# Patient Record
Sex: Female | Born: 1985
Health system: Southern US, Community
[De-identification: ages and names within clinical notes are randomized; demographics above are authoritative.]

## PROBLEM LIST (undated history)

## (undated) DIAGNOSIS — M549 Dorsalgia, unspecified: Secondary | ICD-10-CM

## (undated) DIAGNOSIS — M4802 Spinal stenosis, cervical region: Secondary | ICD-10-CM

## (undated) HISTORY — DX: Spinal stenosis, cervical region: M48.02

## (undated) HISTORY — PX: OTHER SURGICAL HISTORY: SHX169

---

## 2014-06-19 ENCOUNTER — Encounter (HOSPITAL_BASED_OUTPATIENT_CLINIC_OR_DEPARTMENT_OTHER): Payer: Self-pay | Admitting: Emergency Medicine

## 2014-06-19 ENCOUNTER — Emergency Department (HOSPITAL_BASED_OUTPATIENT_CLINIC_OR_DEPARTMENT_OTHER)
Admission: EM | Admit: 2014-06-19 | Discharge: 2014-06-19 | Disposition: A | Discharge status: Home / Self Care | Payer: Self-pay | Attending: Emergency Medicine | Admitting: Emergency Medicine

## 2014-06-19 DIAGNOSIS — N898 Other specified noninflammatory disorders of vagina: Secondary | ICD-10-CM

## 2014-06-19 DIAGNOSIS — R102 Pelvic and perineal pain: Secondary | ICD-10-CM | POA: Insufficient documentation

## 2014-06-19 DIAGNOSIS — Z711 Person with feared health complaint in whom no diagnosis is made: Secondary | ICD-10-CM

## 2014-06-19 DIAGNOSIS — L293 Anogenital pruritus, unspecified: Secondary | ICD-10-CM | POA: Insufficient documentation

## 2014-06-19 DIAGNOSIS — Z202 Contact with and (suspected) exposure to infections with a predominantly sexual mode of transmission: Secondary | ICD-10-CM | POA: Insufficient documentation

## 2014-06-19 DIAGNOSIS — Z3202 Encounter for pregnancy test, result negative: Secondary | ICD-10-CM | POA: Insufficient documentation

## 2014-06-19 LAB — URINALYSIS, ROUTINE W REFLEX MICROSCOPIC
Bilirubin Urine: NEGATIVE
Glucose, UA: NEGATIVE mg/dL
Hgb urine dipstick: NEGATIVE
Ketones, ur: NEGATIVE mg/dL
LEUKOCYTES UA: NEGATIVE
Nitrite: NEGATIVE
Protein, ur: NEGATIVE mg/dL
SPECIFIC GRAVITY, URINE: 1.019 (ref 1.005–1.030)
UROBILINOGEN UA: 1 mg/dL (ref 0.0–1.0)
pH: 6.5 (ref 5.0–8.0)

## 2014-06-19 LAB — WET PREP, GENITAL
CLUE CELLS WET PREP: NONE SEEN
Trich, Wet Prep: NONE SEEN
Yeast Wet Prep HPF POC: NONE SEEN

## 2014-06-19 LAB — GC/CHLAMYDIA PROBE AMP (~~LOC~~) NOT AT ARMC
Chlamydia: NEGATIVE
Neisseria Gonorrhea: NEGATIVE

## 2014-06-19 LAB — PREGNANCY, URINE: Preg Test, Ur: NEGATIVE

## 2014-06-19 MED ORDER — AZITHROMYCIN 250 MG PO TABS
1000.0000 mg | ORAL_TABLET | Freq: Once | ORAL | Status: AC
  Administered 2014-06-19: 1000 mg via ORAL
  Filled 2014-06-19: qty 4

## 2014-06-19 MED ORDER — CEFTRIAXONE SODIUM 250 MG IJ SOLR
250.0000 mg | Freq: Once | INTRAMUSCULAR | Status: AC
  Administered 2014-06-19: 250 mg via INTRAMUSCULAR
  Filled 2014-06-19: qty 250

## 2014-06-19 MED ORDER — OXYCODONE-ACETAMINOPHEN 5-325 MG PO TABS
1.0000 | ORAL_TABLET | Freq: Once | ORAL | Status: AC
  Administered 2014-06-19: 1 via ORAL
  Filled 2014-06-19: qty 1

## 2014-06-19 MED ORDER — LIDOCAINE HCL (PF) 1 % IJ SOLN
INTRAMUSCULAR | Status: AC
  Administered 2014-06-19: 1.2 mL
  Filled 2014-06-19: qty 5

## 2014-06-19 MED ORDER — FLUCONAZOLE 50 MG PO TABS
150.0000 mg | ORAL_TABLET | Freq: Once | ORAL | Status: AC
  Administered 2014-06-19: 150 mg via ORAL
  Filled 2014-06-19 (×2): qty 1

## 2014-06-19 NOTE — Discharge Instructions (Signed)
You were seen today for vaginal itching and pelvic pain. Your workup is reassuring. He has no significant tenderness on exam. Your tested and treated for STDs. You should abstain from sexual activity for the next 10 days. She return precautions below.  Sexually Transmitted Disease A sexually transmitted disease (STD) is a disease or infection that may be passed (transmitted) from person to person, usually during sexual activity. This may happen by way of saliva, semen, blood, vaginal mucus, or urine. Common STDs include:   Gonorrhea.   Chlamydia.   Syphilis.   HIV and AIDS.   Genital herpes.   Hepatitis B and C.   Trichomonas.   Human papillomavirus (HPV).   Pubic lice.   Scabies.  Mites.  Bacterial vaginosis. WHAT ARE CAUSES OF STDs? An STD may be caused by bacteria, a virus, or parasites. STDs are often transmitted during sexual activity if one person is infected. However, they may also be transmitted through nonsexual means. STDs may be transmitted after:   Sexual intercourse with an infected person.   Sharing sex toys with an infected person.   Sharing needles with an infected person or using unclean piercing or tattoo needles.  Having intimate contact with the genitals, mouth, or rectal areas of an infected person.   Exposure to infected fluids during birth. WHAT ARE THE SIGNS AND SYMPTOMS OF STDs? Different STDs have different symptoms. Some people may not have any symptoms. If symptoms are present, they may include:   Painful or bloody urination.   Pain in the pelvis, abdomen, vagina, anus, throat, or eyes.   A skin rash, itching, or irritation.  Growths, ulcerations, blisters, or sores in the genital and anal areas.  Abnormal vaginal discharge with or without bad odor.   Penile discharge in men.   Fever.   Pain or bleeding during sexual intercourse.   Swollen glands in the groin area.   Yellow skin and eyes (jaundice). This is  seen with hepatitis.   Swollen testicles.  Infertility.  Sores and blisters in the mouth. HOW ARE STDs DIAGNOSED? To make a diagnosis, your health care provider may:   Take a medical history.   Perform a physical exam.   Take a sample of any discharge to examine.  Swab the throat, cervix, opening to the penis, rectum, or vagina for testing.  Test a sample of your first morning urine.   Perform blood tests.   Perform a Pap test, if this applies.   Perform a colposcopy.   Perform a laparoscopy.  HOW ARE STDs TREATED? Treatment depends on the STD. Some STDs may be treated but not cured.   Chlamydia, gonorrhea, trichomonas, and syphilis can be cured with antibiotic medicine.   Genital herpes, hepatitis, and HIV can be treated, but not cured, with prescribed medicines. The medicines lessen symptoms.   Genital warts from HPV can be treated with medicine or by freezing, burning (electrocautery), or surgery. Warts may come back.   HPV cannot be cured with medicine or surgery. However, abnormal areas may be removed from the cervix, vagina, or vulva.   If your diagnosis is confirmed, your recent sexual partners need treatment. This is true even if they are symptom-free or have a negative culture or evaluation. They should not have sex until their health care providers say it is okay. HOW CAN I REDUCE MY RISK OF GETTING AN STD? Take these steps to reduce your risk of getting an STD:  Use latex condoms, dental dams, and water-soluble lubricants  during sexual activity. Do not use petroleum jelly or oils.  Avoid having multiple sex partners.  Do not have sex with someone who has other sex partners.  Do not have sex with anyone you do not know or who is at high risk for an STD.  Avoid risky sex practices that can break your skin.  Do not have sex if you have open sores on your mouth or skin.  Avoid drinking too much alcohol or taking illegal drugs. Alcohol and  drugs can affect your judgment and put you in a vulnerable position.  Avoid engaging in oral and anal sex acts.  Get vaccinated for HPV and hepatitis. If you have not received these vaccines in the past, talk to your health care provider about whether one or both might be right for you.   If you are at risk of being infected with HIV, it is recommended that you take a prescription medicine daily to prevent HIV infection. This is called pre-exposure prophylaxis (PrEP). You are considered at risk if:  You are a man who has sex with other men (MSM).  You are a heterosexual man or woman and are sexually active with more than one partner.  You take drugs by injection.  You are sexually active with a partner who has HIV.  Talk with your health care provider about whether you are at high risk of being infected with HIV. If you choose to begin PrEP, you should first be tested for HIV. You should then be tested every 3 months for as long as you are taking PrEP.  WHAT SHOULD I DO IF I THINK I HAVE AN STD?  See your health care provider.   Tell your sexual partner(s). They should be tested and treated for any STDs.  Do not have sex until your health care provider says it is okay. WHEN SHOULD I GET IMMEDIATE MEDICAL CARE? Contact your health care provider right away if:   You have severe abdominal pain.  You are a man and notice swelling or pain in your testicles.  You are a woman and notice swelling or pain in your vagina. Document Released: 04/23/2002 Document Revised: 02/05/2013 Document Reviewed: 08/21/2012 [facility] Patient Information 2015 [place], [place]. This information is not intended to replace advice given to you by your health care provider. Make sure you discuss any questions you have with your health care provider.

## 2014-06-19 NOTE — ED Provider Notes (Signed)
CSN: [id number]     Arrival date & time 06/19/14  0155 History   First MD Initiated Contact with Patient 06/19/14 0209     Chief Complaint  Patient presents with  . Vaginal Itching     (Consider location/radiation/quality/duration/timing/severity/associated sxs/prior Treatment) HPI  This is a 29 year old female with no significant past medical history who presents with vaginal itching and pelvic pain. Patient reports 3 day history of vaginal itching and odor. She also reports crampy, sharp pelvic pain that does not lateralize the last several days. Reports that at 10 pain. She's not taking any medications for her pain. She denies any dysuria or hematuria. She denies any vaginal discharge. She is currently sexually active with one partner. She reports consistent condom use. Last menstrual period was 2 weeks ago. She is not on birth control.  Patient is unable to get into her primary physician. Patient denies any nausea, vomiting, diarrhea, fevers.  History reviewed. No pertinent past medical history. History reviewed. No pertinent past surgical history. History reviewed. No pertinent family history. History  Substance Use Topics  . Smoking status: Never Smoker   . Smokeless tobacco: Not on file  . Alcohol Use: No   OB History    No data available     Review of Systems  Constitutional: Negative for fever.  Respiratory: Negative for chest tightness and shortness of breath.   Cardiovascular: Negative for chest pain.  Gastrointestinal: Negative for nausea, vomiting and abdominal pain.  Genitourinary: Positive for pelvic pain. Negative for dysuria, vaginal bleeding and vaginal discharge.  Skin: Negative for rash.  Neurological: Negative for headaches.  All other systems reviewed and are negative.     Allergies  Review of patient's allergies indicates no known allergies.  Home Medications   Prior to Admission medications   Not on File   BP 120/68 mmHg  Pulse 59  Temp(Src)  97.9 F (36.6 C) (Oral)  Resp 16  Ht 5\' 6"  (1.676 m)  Wt 172 lb (78.019 kg)  BMI 27.77 kg/m2  SpO2 100%  LMP 06/05/2014 (Exact Date) Physical Exam  Constitutional: She is oriented to person, place, and time. She appears well-developed and well-nourished. No distress.  HENT:  Head: Normocephalic and atraumatic.  Cardiovascular: Normal rate, regular rhythm and normal heart sounds.   Pulmonary/Chest: Effort normal. No respiratory distress. She has no wheezes.  Abdominal: Soft. Bowel sounds are normal. There is no tenderness. There is no rebound.  Genitourinary: Vagina normal and uterus normal.  No cervical motion tenderness, no cervical friability noticed, no adnexal tenderness, scant vaginal discharge  Neurological: She is alert and oriented to person, place, and time.  Skin: Skin is warm and dry.  Psychiatric: She has a normal mood and affect.  Nursing note and vitals reviewed.   ED Course  Procedures (including critical care time) Labs Review Labs Reviewed  WET PREP, GENITAL - Abnormal; Notable for the following:    WBC, Wet Prep HPF POC MODERATE (*)    All other components within normal limits  PREGNANCY, URINE  URINALYSIS, ROUTINE W REFLEX MICROSCOPIC  RPR  HIV ANTIBODY (ROUTINE TESTING)  GC/CHLAMYDIA PROBE AMP ([place])    Imaging Review No results found.   EKG Interpretation None      MDM   Final diagnoses:  Vaginal itching  Pelvic pain in female  Concern about STD in female without diagnosis    Patient presents with vaginal itching and pelvic pain. Nontoxic on exam. Nontender on exam. GU exam is largely unremarkable  without cervical motion tenderness or adnexal tenderness. Discuss with patient screening for STDs and empirically treatment. Patient agrees with plan. Patient given Rocephin and azithromycin. GC, chlamydia, RPR, and HIV pending. Wet prep is reassuring and urinalysis is unremarkable. Patient is well-appearing. Do not feel she needs imaging at  this time. Low suspicion for ovarian torsion or other intra-abdominal pathology including appendicitis. Patient to follow-up with PCP.  After history, exam, and medical workup I feel the patient has been appropriately medically screened and is safe for discharge home. Pertinent diagnoses were discussed with the patient. Patient was given return precautions.    Shon Batonourtney F Horton, MD 06/19/14 [id number]

## 2014-06-19 NOTE — ED Notes (Signed)
Patient states that she has had an "odar" and itching x 2 - 3 days with pelvic pain. Attempted to go to MD and patient was unable to get appointment.

## 2014-06-20 LAB — HIV ANTIBODY (ROUTINE TESTING W REFLEX): HIV Screen 4th Generation wRfx: NONREACTIVE

## 2014-06-20 LAB — RPR: RPR: NONREACTIVE

## 2014-09-12 ENCOUNTER — Emergency Department (HOSPITAL_BASED_OUTPATIENT_CLINIC_OR_DEPARTMENT_OTHER)
Admission: EM | Admit: 2014-09-12 | Discharge: 2014-09-13 | Disposition: A | Discharge status: Home / Self Care | Payer: Medicaid - Out of State | Attending: Emergency Medicine | Admitting: Emergency Medicine

## 2014-09-12 ENCOUNTER — Encounter (HOSPITAL_BASED_OUTPATIENT_CLINIC_OR_DEPARTMENT_OTHER): Payer: Self-pay | Admitting: *Deleted

## 2014-09-12 DIAGNOSIS — S60211A Contusion of right wrist, initial encounter: Secondary | ICD-10-CM | POA: Insufficient documentation

## 2014-09-12 DIAGNOSIS — S40011A Contusion of right shoulder, initial encounter: Secondary | ICD-10-CM | POA: Insufficient documentation

## 2014-09-12 DIAGNOSIS — Y9389 Activity, other specified: Secondary | ICD-10-CM | POA: Diagnosis not present

## 2014-09-12 DIAGNOSIS — Y990 Civilian activity done for income or pay: Secondary | ICD-10-CM | POA: Diagnosis not present

## 2014-09-12 DIAGNOSIS — W010XXA Fall on same level from slipping, tripping and stumbling without subsequent striking against object, initial encounter: Secondary | ICD-10-CM | POA: Diagnosis not present

## 2014-09-12 DIAGNOSIS — Y9289 Other specified places as the place of occurrence of the external cause: Secondary | ICD-10-CM | POA: Insufficient documentation

## 2014-09-12 DIAGNOSIS — S4991XA Unspecified injury of right shoulder and upper arm, initial encounter: Secondary | ICD-10-CM | POA: Diagnosis present

## 2014-09-12 DIAGNOSIS — S20221A Contusion of right back wall of thorax, initial encounter: Secondary | ICD-10-CM | POA: Diagnosis not present

## 2014-09-12 NOTE — ED Notes (Signed)
Patient states she tripped at work falling on her right side and now has back/ shoulder and wrist pain. Denies hitting head. Rates pain 8/10. Patient states she has had 2 Ibuprofen around 1930 (for her menstrual pain, not injury).

## 2014-09-12 NOTE — ED Notes (Signed)
Pt fell at work, reports right shoulder and mid back pain.  Ambulatory.

## 2014-09-13 ENCOUNTER — Emergency Department (HOSPITAL_BASED_OUTPATIENT_CLINIC_OR_DEPARTMENT_OTHER): Payer: Medicaid - Out of State

## 2014-09-13 MED ORDER — TRAMADOL HCL 50 MG PO TABS: 50.0000 mg | ORAL_TABLET | Freq: Four times a day (QID) | ORAL | Status: DC | PRN

## 2014-09-13 NOTE — ED Provider Notes (Signed)
CSN: [id number]     Arrival date & time 09/12/14  2236 History   First MD Initiated Contact with Patient 09/13/14 0038     Chief Complaint  Patient presents with  . Fall     (Consider location/radiation/quality/duration/timing/severity/associated sxs/prior Treatment) Patient is a 29 y.o. female presenting with fall. The history is provided by the patient.  Fall  She was at work when she tripped and fell injuring her right shoulder, right wrist, and middle back. She is complaining of pain which he rates at 7/10. She denies head, neck injury and denies injury to the left arm or to her legs. She took a dose of ibuprofen for menstrual cramps but it did not seem to affect her pain.  History reviewed. No pertinent past medical history. History reviewed. No pertinent past surgical history. History reviewed. No pertinent family history. History  Substance Use Topics  . Smoking status: Never Smoker   . Smokeless tobacco: Not on file  . Alcohol Use: No   OB History    No data available     Review of Systems  All other systems reviewed and are negative.     Allergies  Review of patient's allergies indicates no known allergies.  Home Medications   Prior to Admission medications   Not on File   BP 115/78 mmHg  Pulse 76  Temp(Src) 97.7 F (36.5 C) (Oral)  Resp 16  Ht  (1.702 m)  Wt 195 lb (88.451 kg)  BMI 30.53 kg/m2  SpO2 100%  LMP 09/12/2014 Physical Exam  Nursing note and vitals reviewed.  29 year old female, resting comfortably and in no acute distress. Vital signs are  normal. Oxygen saturation is 100%, which is normal. Head is normocephalic and atraumatic. PERRLA, EOMI. Oropharynx is clear. Neck is nontender and supple without adenopathy or JVD. Back is  mildly tender in the mid to lower thoracic spine. There is no lumbar tenderness. There is no CVA tenderness. Lungs are clear without rales, wheezes, or rhonchi. Chest is nontender. Heart has regular rate and  rhythm without murmur. Abdomen is soft, flat, nontender without masses or hepatosplenomegaly and peristalsis is normoactive. Extremities: No swelling or deformity present. There is moderate tenderness diffusely through the right shoulder, and there is tenderness over the dorsum of the right wrist. There is no tenderness in the anatomic snuffbox and there is noted pain with axial loading of the thumb. Full passive range of motion is present without pain. Distal neurovascular exam is intact with normal sensation, normal pulses, prompt capillary refill. Skin is warm and dry without rash. Neurologic: Mental status is normal, cranial nerves are intact, there are no motor or sensory deficits.  ED Course  Procedures (including critical care time)  Imaging Review Dg Thoracic Spine W/swimmers  09/13/2014   CLINICAL DATA:  Central midthoracic pain after fall. Fall from slip, trip or stumble. Initial encounter.  EXAM: THORACIC SPINE - 3 VIEWS  COMPARISON:  None.  FINDINGS: The alignment is maintained. Vertebral body heights are maintained. No significant disc space narrowing. Posterior elements appear intact. There is no paravertebral soft tissue abnormality.  IMPRESSION: Negative.   Electronically Signed   By: Rubye Oaks M.D.   On: 09/13/2014 01:33   Dg Shoulder Right  09/13/2014   CLINICAL DATA:  Right shoulder pain after fall  EXAM: RIGHT SHOULDER - 2+ VIEW  COMPARISON:  None.  FINDINGS: There is no evidence of fracture or dislocation. There is no evidence of arthropathy or other focal  bone abnormality. Soft tissues are unremarkable.  IMPRESSION: Negative.   Electronically Signed   By: Ellery Plunk M.D.   On: 09/13/2014 01:33   Dg Wrist Complete Right  09/13/2014   CLINICAL DATA:  Right wrist pain after fall from slip, trip, or stumble. Initial encounter.  EXAM: RIGHT WRIST - COMPLETE 3+ VIEW  COMPARISON:  None.  FINDINGS: No fracture or dislocation. The alignment and joint spaces are maintained.  The scaphoid is intact. There is no focal soft tissue abnormality.  IMPRESSION: Negative.   Electronically Signed   By: Rubye Oaks M.D.   On: 09/13/2014 01:34    MDM   Final diagnoses:  Fall from slip, trip, or stumble, initial encounter  Contusion of right shoulder, initial encounter  Contusion of right wrist, initial encounter  Contusion of mid back, right, initial encounter   Fall at work without evidence of significant injury. She is being sent for x-rays.  X-rays show no evidence of fracture. She is advised to use over-the-counter analgesia as needed for pain. Prescription given for tramadol.  Dione Booze, MD 09/13/14 [phone]

## 2014-09-13 NOTE — Discharge Instructions (Signed)
Take ibuprofen, naproxen, or acetaminophen as needed for less severe pain.  Contusion A contusion is a deep bruise. Contusions are the result of an injury that caused bleeding under the skin. The contusion may turn blue, purple, or yellow. Minor injuries will give you a painless contusion, but more severe contusions may stay painful and swollen for a few weeks.  CAUSES  A contusion is usually caused by a blow, trauma, or direct force to an area of the body. SYMPTOMS   Swelling and redness of the injured area.  Bruising of the injured area.  Tenderness and soreness of the injured area.  Pain. DIAGNOSIS  The diagnosis can be made by taking a history and physical exam. An X-ray, CT scan, or MRI may be needed to determine if there were any associated injuries, such as fractures. TREATMENT  Specific treatment will depend on what area of the body was injured. In general, the best treatment for a contusion is resting, icing, elevating, and applying cold compresses to the injured area. Over-the-counter medicines may also be recommended for pain control. Ask your caregiver what the best treatment is for your contusion. HOME CARE INSTRUCTIONS   Put ice on the injured area.  Put ice in a plastic bag.  Place a towel between your skin and the bag.  Leave the ice on for 15-20 minutes, 3-4 times a day, or as directed by your health care provider.  Only take over-the-counter or prescription medicines for pain, discomfort, or fever as directed by your caregiver. Your caregiver may recommend avoiding anti-inflammatory medicines (aspirin, ibuprofen, and naproxen) for 48 hours because these medicines may increase bruising.  Rest the injured area.  If possible, elevate the injured area to reduce swelling. SEEK IMMEDIATE MEDICAL CARE IF:   You have increased bruising or swelling.  You have pain that is getting worse.  Your swelling or pain is not relieved with medicines. MAKE SURE YOU:    Understand these instructions.  Will watch your condition.  Will get help right away if you are not doing well or get worse. Document Released: 11/10/2004 Document Revised: 02/05/2013 Document Reviewed: 12/06/2010 [facility] Patient Information 2015 [place], [place]. This information is not intended to replace advice given to you by your health care provider. Make sure you discuss any questions you have with your health care provider.  Tramadol tablets What is this medicine? TRAMADOL (TRA ma dole) is a pain reliever. It is used to treat moderate to severe pain in adults. This medicine may be used for other purposes; ask your health care provider or pharmacist if you have questions. COMMON BRAND NAME(S): Ultram What should I tell my health care provider before I take this medicine? They need to know if you have any of these conditions: -brain tumor -depression -drug abuse or addiction -head injury -if you frequently drink alcohol containing drinks -kidney disease or trouble passing urine -liver disease -lung disease, asthma, or breathing problems -seizures or epilepsy -suicidal thoughts, plans, or attempt; a previous suicide attempt by you or a family member -an unusual or allergic reaction to tramadol, codeine, other medicines, foods, dyes, or preservatives -pregnant or trying to get pregnant -breast-feeding How should I use this medicine? Take this medicine by mouth with a full glass of water. Follow the directions on the prescription label. If the medicine upsets your stomach, take it with food or milk. Do not take more medicine than you are told to take. Talk to your pediatrician regarding the use of this medicine in  children. Special care may be needed. Overdosage: If you think you have taken too much of this medicine contact a poison control center or emergency room at once. NOTE: This medicine is only for you. Do not share this medicine with others. What if I miss a dose? If  you miss a dose, take it as soon as you can. If it is almost time for your next dose, take only that dose. Do not take double or extra doses. What may interact with this medicine? Do not take this medicine with any of the following medications: -MAOIs like Carbex, Eldepryl, Marplan, Nardil, and Parnate This medicine may also interact with the following medications: -alcohol or medicines that contain alcohol -antihistamines -benzodiazepines -bupropion -carbamazepine or oxcarbazepine -clozapine -cyclobenzaprine -digoxin -furazolidone -linezolid -medicines for depression, anxiety, or psychotic disturbances -medicines for migraine headache like almotriptan, eletriptan, frovatriptan, naratriptan, rizatriptan, sumatriptan, zolmitriptan -medicines for pain like pentazocine, buprenorphine, butorphanol, meperidine, nalbuphine, and propoxyphene -medicines for sleep -muscle relaxants -naltrexone -phenobarbital -phenothiazines like perphenazine, thioridazine, chlorpromazine, mesoridazine, fluphenazine, prochlorperazine, promazine, and trifluoperazine -procarbazine -warfarin This list may not describe all possible interactions. Give your health care provider a list of all the medicines, herbs, non-prescription drugs, or dietary supplements you use. Also tell them if you smoke, drink alcohol, or use illegal drugs. Some items may interact with your medicine. What should I watch for while using this medicine? Tell your doctor or health care professional if your pain does not go away, if it gets worse, or if you have new or a different type of pain. You may develop tolerance to the medicine. Tolerance means that you will need a higher dose of the medicine for pain relief. Tolerance is normal and is expected if you take this medicine for a long time. Do not suddenly stop taking your medicine because you may develop a severe reaction. Your body becomes used to the medicine. This does NOT mean you are  addicted. Addiction is a behavior related to getting and using a drug for a non-medical reason. If you have pain, you have a medical reason to take pain medicine. Your doctor will tell you how much medicine to take. If your doctor wants you to stop the medicine, the dose will be slowly lowered over time to avoid any side effects. You may get drowsy or dizzy. Do not drive, use machinery, or do anything that needs mental alertness until you know how this medicine affects you. Do not stand or sit up quickly, especially if you are an older patient. This reduces the risk of dizzy or fainting spells. Alcohol can increase or decrease the effects of this medicine. Avoid alcoholic drinks. You may have constipation. Try to have a bowel movement at least every 2 to 3 days. If you do not have a bowel movement for 3 days, call your doctor or health care professional. Your mouth may get dry. Chewing sugarless gum or sucking hard candy, and drinking plenty of water may help. Contact your doctor if the problem does not go away or is severe. What side effects may I notice from receiving this medicine? Side effects that you should report to your doctor or health care professional as soon as possible: -allergic reactions like skin rash, itching or hives, swelling of the face, lips, or tongue -breathing difficulties, wheezing -confusion -itching -light headedness or fainting spells -redness, blistering, peeling or loosening of the skin, including inside the mouth -seizures Side effects that usually do not require medical attention (report to your doctor or  health care professional if they continue or are bothersome): -constipation -dizziness -drowsiness -headache -nausea, vomiting This list may not describe all possible side effects. Call your doctor for medical advice about side effects. You may report side effects to FDA at 1-800-FDA-1088. Where should I keep my medicine? Keep out of the reach of children. Store  at room temperature between 15 and 30 degrees C (59 and 86 degrees F). Keep container tightly closed. Throw away any unused medicine after the expiration date. NOTE: This sheet is a summary. It may not cover all possible information. If you have questions about this medicine, talk to your doctor, pharmacist, or health care provider.  2015, [employer]. (2009-10-14 11:55:44)

## 2014-11-04 ENCOUNTER — Emergency Department (HOSPITAL_BASED_OUTPATIENT_CLINIC_OR_DEPARTMENT_OTHER)
Admission: EM | Admit: 2014-11-04 | Discharge: 2014-11-04 | Disposition: A | Discharge status: Home / Self Care | Attending: Emergency Medicine | Admitting: Emergency Medicine

## 2014-11-04 ENCOUNTER — Emergency Department (HOSPITAL_BASED_OUTPATIENT_CLINIC_OR_DEPARTMENT_OTHER): Payer: Self-pay

## 2014-11-04 ENCOUNTER — Encounter (HOSPITAL_BASED_OUTPATIENT_CLINIC_OR_DEPARTMENT_OTHER): Payer: Self-pay | Admitting: Emergency Medicine

## 2014-11-04 DIAGNOSIS — R52 Pain, unspecified: Secondary | ICD-10-CM

## 2014-11-04 DIAGNOSIS — M25531 Pain in right wrist: Secondary | ICD-10-CM | POA: Insufficient documentation

## 2014-11-04 MED ORDER — IBUPROFEN 800 MG PO TABS
800.0000 mg | ORAL_TABLET | Freq: Once | ORAL | Status: AC
  Administered 2014-11-04: 800 mg via ORAL
  Filled 2014-11-04: qty 1

## 2014-11-04 MED ORDER — IBUPROFEN 800 MG PO TABS: 800.0000 mg | ORAL_TABLET | Freq: Three times a day (TID) | ORAL | Status: DC

## 2014-11-04 NOTE — Discharge Instructions (Signed)
Arthralgia Wear the splint as needed for comfort. Call Dr. Carlos Levering office tomorrow to schedule the next available appointment Arthralgia is joint pain. A joint is a place where two bones meet. Joint pain can happen for many reasons. The joint can be bruised, stiff, infected, or weak from aging. Pain usually goes away after resting and taking medicine for soreness.  HOME CARE  Rest the joint as told by your doctor.  Keep the sore joint raised (elevated) for the first 24 hours.  Put ice on the joint area.  Put ice in a plastic bag.  Place a towel between your skin and the bag.  Leave the ice on for 15-20 minutes, 03-04 times a day.  Wear your splint, casting, elastic bandage, or sling as told by your doctor.  Only take medicine as told by your doctor. Do not take aspirin.  Use crutches as told by your doctor. Do not put weight on the joint until told to by your doctor. GET HELP RIGHT AWAY IF:   You have bruising, puffiness (swelling), or more pain.  Your fingers or toes turn blue or start to lose feeling (numb).  Your medicine does not lessen the pain.  Your pain becomes severe.  You have a temperature by mouth above 102 F (38.9 C), not controlled by medicine.  You cannot move or use the joint. MAKE SURE YOU:   Understand these instructions.  Will watch your condition.  Will get help right away if you are not doing well or get worse. Document Released: 01/19/2009 Document Revised: 04/25/2011 Document Reviewed: 01/19/2009 [facility] Patient Information 2015 [place], [place]. This information is not intended to replace advice given to you by your health care provider. Make sure you discuss any questions you have with your health care provider.

## 2014-11-04 NOTE — ED Notes (Signed)
MD at bedside. 

## 2014-11-04 NOTE — ED Notes (Signed)
Pt had worker comp injury, states she was diagnosed with contusion to right arm, started back full duty last thur, reports pain to right wrist that radiates to shoulder

## 2014-11-04 NOTE — ED Notes (Signed)
Nurse first-pt to desk to ? Wait time-advised by reg clerk how ED pts are seen-pt NAD

## 2014-11-04 NOTE — ED Provider Notes (Signed)
CSN: [id number]     Arrival date & time 11/04/14  1849 History  This chart was scribed for Doug Sou, MD by Elon Spanner, ED Scribe. This patient was seen in room MHFT1/MHFT1 and the patient's care was started at 10:35 PM.   Chief Complaint  Patient presents with  . Shoulder Pain   The history is provided by the patient. No language interpreter was used.   HPI Comments: Brooke Silva is a 29 y.o. female who presents to the Emergency Department complaining of a recurrence of right wrist pain onset 1.5 weeks ago.  The patient was seen after a fall on 7/28 at work.  She was dx'd with a contusion of the right forearm and there were no other abnormalities.  She was placed on light duty for 3 weeks, given a wrist splint, and additionally followed up with her PCP.  Her pain resolved and she returned to work without any duty restrictions 1.5 weeks ago, which caused her wrist pain to recur.  She reports her worker's comp claim was denied.  She has previously taken ibuprofen with some relief but has not taken anything since recurrence.  She also notes a small, gradually worsening area of non-painful swelling on the wrist that developed a few days ago History reviewed. No pertinent past medical history. past medical history negative History reviewed. No pertinent past surgical history. History reviewed. No pertinent family history. Social History  Substance Use Topics  . Smoking status: Never Smoker   . Smokeless tobacco: None  . Alcohol Use: No   OB History    No data available     Review of Systems  Constitutional: Negative.   Musculoskeletal: Positive for arthralgias.      Allergies  Review of patient's allergies indicates no known allergies.  Home Medications   Prior to Admission medications   Medication Sig Start Date End Date Taking? Authorizing Provider  traMADol (ULTRAM) 50 MG tablet Take 1 tablet (50 mg total) by mouth every 6 (six) hours as needed. 09/13/14   Dione Booze,  MD   BP 103/67 mmHg  Pulse 79  Temp(Src) 98.5 F (36.9 C) (Oral)  Resp 16  Ht  (1.702 m)  Wt 195 lb (88.451 kg)  BMI 30.53 kg/m2  SpO2 100%  LMP 10/14/2014 Physical Exam  Constitutional: She appears well-developed and well-nourished.  HENT:  Head: Normocephalic and atraumatic.  Eyes: EOM are normal.  Neck: Neck supple. No tracheal deviation present. No thyromegaly present.  Cardiovascular: Normal rate and regular rhythm.   No murmur heard. Pulmonary/Chest: Effort normal and breath sounds normal.  Abdominal: Soft. Bowel sounds are normal. She exhibits no distension. There is no tenderness.  Musculoskeletal: Normal range of motion. She exhibits tenderness. She exhibits no edema.  0.5 cm tender nodule at dorsum of left wrist. Not red or warm. Wrist with full range of motion with pain on extension. Shoulder elbow and hand with full range of motion without pain. All other extremities without redness or tenderness neurovascular intact  Neurological: She is alert. Coordination normal.  Skin: Skin is warm and dry. No rash noted.  Psychiatric: She has a normal mood and affect.  Nursing note and vitals reviewed.   ED Course  Procedures (including critical care time)  DIAGNOSTIC STUDIES: Oxygen Saturation is 100% on RA, normal by my interpretation.    COORDINATION OF CARE:  10:43 PM Will provide splint and order/prescribe ibuprofen.  Will provide referral to hand surgeon.  Patient acknowledges and agrees with plan.  Labs Review Labs Reviewed - No data to display  Imaging Review Dg Wrist Complete Right  11/04/2014   CLINICAL DATA:  Right wrist pain after falling in June of 2016. Continuous and worsening pain with protrusion of bone mid right wrist during flexion. No recent injury.  EXAM: RIGHT WRIST - COMPLETE 3+ VIEW  COMPARISON:  09/13/2014  FINDINGS: There is no evidence of fracture or dislocation. There is no evidence of arthropathy or other focal bone abnormality. Soft  tissues are unremarkable.  IMPRESSION: Negative.   Electronically Signed   By: Norva Pavlov M.D.   On: 11/04/2014 19:53   I have personally reviewed and evaluated these images and lab results as part of my medical decision-making.   EKG Interpretation None     Results for orders placed or performed during the hospital encounter of 06/19/14  Wet prep, genital  Result Value Ref Range   Yeast Wet Prep HPF POC NONE SEEN NONE SEEN   Trich, Wet Prep NONE SEEN NONE SEEN   Clue Cells Wet Prep HPF POC NONE SEEN NONE SEEN   WBC, Wet Prep HPF POC MODERATE (A) NONE SEEN  Pregnancy, urine  Result Value Ref Range   Preg Test, Ur NEGATIVE NEGATIVE  Urinalysis, Routine w reflex microscopic  Result Value Ref Range   Color, Urine YELLOW YELLOW   APPearance CLEAR CLEAR   Specific Gravity, Urine 1.019 1.005 - 1.030   pH 6.5 5.0 - 8.0   Glucose, UA NEGATIVE NEGATIVE mg/dL   Hgb urine dipstick NEGATIVE NEGATIVE   Bilirubin Urine NEGATIVE NEGATIVE   Ketones, ur NEGATIVE NEGATIVE mg/dL   Protein, ur NEGATIVE NEGATIVE mg/dL   Urobilinogen, UA 1.0 0.0 - 1.0 mg/dL   Nitrite NEGATIVE NEGATIVE   Leukocytes, UA NEGATIVE NEGATIVE  RPR  Result Value Ref Range   RPR Ser Ql Non Reactive Non Reactive  HIV antibody  Result Value Ref Range   HIV Screen 4th Generation wRfx Non Reactive Non Reactive  GC/Chlamydia probe amp ([place])  Result Value Ref Range   Chlamydia Negative    Neisseria gonorrhea Negative    Dg Wrist Complete Right  11/04/2014   CLINICAL DATA:  Right wrist pain after falling in June of 2016. Continuous and worsening pain with protrusion of bone mid right wrist during flexion. No recent injury.  EXAM: RIGHT WRIST - COMPLETE 3+ VIEW  COMPARISON:  09/13/2014  FINDINGS: There is no evidence of fracture or dislocation. There is no evidence of arthropathy or other focal bone abnormality. Soft tissues are unremarkable.  IMPRESSION: Negative.   Electronically Signed   By: Norva Pavlov  M.D.   On: 11/04/2014 19:53    MDM  Plan prescription ibuprofen, Velcro wrist splint. Referral Dr. Amanda Pea Final diagnoses:  Pain   diagnosis chronic right wrist pain  I personally performed the services described in this documentation, which was scribed in my presence. The recorded information has been reviewed and considered.      Doug Sou, MD 11/04/14 2300

## 2014-11-06 ENCOUNTER — Ambulatory Visit: Payer: Medicaid - Out of State | Admitting: Family Medicine

## 2014-12-09 ENCOUNTER — Encounter (HOSPITAL_BASED_OUTPATIENT_CLINIC_OR_DEPARTMENT_OTHER): Payer: Self-pay | Admitting: *Deleted

## 2014-12-09 ENCOUNTER — Emergency Department (HOSPITAL_BASED_OUTPATIENT_CLINIC_OR_DEPARTMENT_OTHER)
Admission: EM | Admit: 2014-12-09 | Discharge: 2014-12-09 | Disposition: A | Discharge status: Home / Self Care | Payer: Medicaid - Out of State | Attending: Emergency Medicine | Admitting: Emergency Medicine

## 2014-12-09 DIAGNOSIS — K2970 Gastritis, unspecified, without bleeding: Secondary | ICD-10-CM | POA: Diagnosis not present

## 2014-12-09 DIAGNOSIS — R350 Frequency of micturition: Secondary | ICD-10-CM | POA: Insufficient documentation

## 2014-12-09 DIAGNOSIS — R1110 Vomiting, unspecified: Secondary | ICD-10-CM | POA: Insufficient documentation

## 2014-12-09 DIAGNOSIS — R0602 Shortness of breath: Secondary | ICD-10-CM | POA: Diagnosis not present

## 2014-12-09 DIAGNOSIS — Z791 Long term (current) use of non-steroidal anti-inflammatories (NSAID): Secondary | ICD-10-CM | POA: Insufficient documentation

## 2014-12-09 DIAGNOSIS — K920 Hematemesis: Secondary | ICD-10-CM | POA: Diagnosis present

## 2014-12-09 MED ORDER — OMEPRAZOLE 20 MG PO CPDR: 20.0000 mg | DELAYED_RELEASE_CAPSULE | Freq: Every day | ORAL | Status: DC

## 2014-12-09 MED ORDER — ACETAMINOPHEN 325 MG PO TABS
650.0000 mg | ORAL_TABLET | Freq: Four times a day (QID) | ORAL | Status: DC | PRN
Start: 2014-12-09 — End: 2015-01-08

## 2014-12-09 MED ORDER — TRAMADOL HCL 50 MG PO TABS: 50.0000 mg | ORAL_TABLET | Freq: Four times a day (QID) | ORAL | Status: DC | PRN

## 2014-12-09 MED ORDER — PANTOPRAZOLE SODIUM 40 MG PO TBEC
40.0000 mg | DELAYED_RELEASE_TABLET | Freq: Once | ORAL | Status: AC
  Administered 2014-12-09: 40 mg via ORAL
  Filled 2014-12-09: qty 1

## 2014-12-09 NOTE — ED Provider Notes (Signed)
CSN: [id number]     Arrival date & time 12/09/14  1853 History  By signing my name below, I, Gwenyth Ober, attest that this documentation has been prepared under the direction and in the presence of Marily Memos, MD.  Electronically Signed: Gwenyth Ober, ED Scribe. 12/09/2014. 7:38 PM.   Chief Complaint  Patient presents with  . hematemesis yesterday    The history is provided by the patient. No language interpreter was used.    HPI Comments: Brooke Silva is a 29 y.o. female with no chronic medical history who presents to the Emergency Department complaining of 1 episode of blood-streaked emesis that occurred yesterday. Pt reports a burning epigastric pain that starts 15 minutes after eating, mild SOB that started a few days ago, urinary frequency and increased flatulence that started 1 week ago as associated symptoms. Her pain is constant, but becomes worse with eating. Pt has been taking Ibuprofen-800 mg, as needed, for shoulder pain. She last took treatment this morning. Pt also takes Metronidazole for recurrent infections.  Pt denies a history of similar symptoms, a history of indigestion, EtOH use and drug abuse. She also denies fever, cough, back pain and rash   History reviewed. No pertinent past medical history. History reviewed. No pertinent past surgical history. History reviewed. No pertinent family history. Social History  Substance Use Topics  . Smoking status: Never Smoker   . Smokeless tobacco: None  . Alcohol Use: No   OB History    No data available     Review of Systems  Constitutional: Negative for fever.  Respiratory: Positive for shortness of breath. Negative for cough.   Gastrointestinal: Positive for vomiting and abdominal pain.  Genitourinary: Positive for frequency.  Musculoskeletal: Negative for back pain.  Skin: Negative for rash.  All other systems reviewed and are negative.  Allergies  Review of patient's allergies indicates no known  allergies.  Home Medications   Prior to Admission medications   Medication Sig Start Date End Date Taking? Authorizing Provider  acetaminophen (TYLENOL) 325 MG tablet Take 2 tablets (650 mg total) by mouth every 6 (six) hours as needed for mild pain. 12/09/14   Marily Memos, MD  ibuprofen (ADVIL,MOTRIN) 800 MG tablet Take 1 tablet (800 mg total) by mouth 3 (three) times daily. 11/04/14   Doug Sou, MD  omeprazole (PRILOSEC) 20 MG capsule Take 1 capsule (20 mg total) by mouth daily. 12/09/14   Marily Memos, MD  traMADol (ULTRAM) 50 MG tablet Take 1 tablet (50 mg total) by mouth every 6 (six) hours as needed. 12/09/14   Marily Memos, MD   BP 118/74 mmHg  Pulse 72  Temp(Src) 98.5 F (36.9 C) (Oral)  Resp 20  Ht  (1.676 m)  Wt 176 lb (79.833 kg)  BMI 28.42 kg/m2  SpO2 99%  LMP 11/09/2014 Physical Exam  Constitutional: She appears well-developed and well-nourished. No distress.  HENT:  Head: Normocephalic and atraumatic.  Eyes: Conjunctivae and EOM are normal.  Neck: Neck supple. No tracheal deviation present.  Cardiovascular: Normal rate, regular rhythm and normal heart sounds.   Pulmonary/Chest: Effort normal and breath sounds normal. No respiratory distress. She has no wheezes.  Abdominal: Soft. Bowel sounds are normal. She exhibits no distension and no mass. There is no tenderness. There is no rebound and no guarding.  Mild epigastric tenderness No peritonitis No rigidity No surgical scars  Skin: Skin is warm and dry. No rash noted.  Psychiatric: She has a normal mood and affect. Her  behavior is normal.  Nursing note and vitals reviewed.   ED Course  Procedures  DIAGNOSTIC STUDIES: Oxygen Saturation is 99% on RA, normal by my interpretation.    COORDINATION OF CARE: 7:20 PM Discussed suspicion for peptic ulcer. Advised pt to take Omeprazole for 1 month and to refrain from NSAID use. Discussed return precautions, including worsening abdominal pain and vomiting  blood. Discussed treatment plan with pt. She agreed to plan.  Labs Review Labs Reviewed - No data to display  Imaging Review No results found.    EKG Interpretation None      MDM   Final diagnoses:  Gastritis   Increased ibuprofen use over the last couple of weeks secondary to shoulder and wrist pain from a fall. Had epigastric pain for the last 1 week, worse after eating. Had one episode of blood-streaked emesis yesterday. But no further abdominal pain or hematemesis. Exam is benign as above. No evidence of peritonitis or perforated ulcer. Vital signs stable. We'll start on omeprazole and discontinue all NSAID use. She can use Tylenol as needed for her pain. She will follow-up with her doctor in a month to ensure that her symptoms are improving and to ensure she has no need for further workup or treatment. She will return here for any worsening or changing symptoms.  I personally performed the services described in this documentation, which was scribed in my presence. The recorded information has been reviewed and is accurate.  I have personally and contemperaneously reviewed labs and imaging and used in my decision making as above.   A medical screening exam was performed and I feel the patient has had an appropriate workup for their chief complaint at this time and likelihood of emergent condition existing is low. They have been counseled on decision, discharge, follow up and which symptoms necessitate immediate return to the emergency department. They or their family verbally stated understanding and agreement with plan and discharged in stable condition.    Marily MemosJason Jermell Holeman, MD 12/09/14 [phone]

## 2014-12-09 NOTE — ED Notes (Signed)
Patient verbalizes understanding of discharge medication and instructions.  Patient stable and ambulatory. 

## 2014-12-09 NOTE — ED Notes (Signed)
Pt amb to triage with quick steady gait in nad. Pt states she has been taking 800mg  motrin for shoulder pain, and she had some emesis with blood in it yesterday.

## 2015-01-07 ENCOUNTER — Encounter (HOSPITAL_BASED_OUTPATIENT_CLINIC_OR_DEPARTMENT_OTHER): Payer: Self-pay | Admitting: Emergency Medicine

## 2015-01-07 ENCOUNTER — Emergency Department (HOSPITAL_BASED_OUTPATIENT_CLINIC_OR_DEPARTMENT_OTHER)
Admission: EM | Admit: 2015-01-07 | Discharge: 2015-01-08 | Disposition: A | Discharge status: Home / Self Care | Payer: Medicaid - Out of State | Attending: Emergency Medicine | Admitting: Emergency Medicine

## 2015-01-07 DIAGNOSIS — Z791 Long term (current) use of non-steroidal anti-inflammatories (NSAID): Secondary | ICD-10-CM | POA: Insufficient documentation

## 2015-01-07 DIAGNOSIS — J069 Acute upper respiratory infection, unspecified: Secondary | ICD-10-CM | POA: Insufficient documentation

## 2015-01-07 DIAGNOSIS — R05 Cough: Secondary | ICD-10-CM | POA: Diagnosis present

## 2015-01-07 DIAGNOSIS — Z79899 Other long term (current) drug therapy: Secondary | ICD-10-CM | POA: Diagnosis not present

## 2015-01-07 NOTE — ED Notes (Signed)
Patient states that she has been coughing and "pucking" - she reports that she is having SOB and she would like to see if she has a "chest congestion"

## 2015-01-07 NOTE — ED Notes (Signed)
Patient has not stopped texting or looking on phone during the whole triage process

## 2015-01-08 ENCOUNTER — Emergency Department (HOSPITAL_BASED_OUTPATIENT_CLINIC_OR_DEPARTMENT_OTHER): Payer: Medicaid - Out of State

## 2015-01-08 MED ORDER — BENZONATATE 100 MG PO CAPS
100.0000 mg | ORAL_CAPSULE | Freq: Once | ORAL | Status: AC
  Administered 2015-01-08: 100 mg via ORAL
  Filled 2015-01-08: qty 1

## 2015-01-08 MED ORDER — ALBUTEROL SULFATE HFA 108 (90 BASE) MCG/ACT IN AERS: 2.0000 | INHALATION_SPRAY | RESPIRATORY_TRACT | Status: DC | PRN

## 2015-01-08 MED ORDER — BENZONATATE 100 MG PO CAPS: 100.0000 mg | ORAL_CAPSULE | Freq: Three times a day (TID) | ORAL | Status: DC | PRN

## 2015-01-08 MED ORDER — IBUPROFEN 800 MG PO TABS: 800.0000 mg | ORAL_TABLET | Freq: Three times a day (TID) | ORAL | Status: DC | PRN

## 2015-01-08 NOTE — ED Provider Notes (Signed)
TIME SEEN: 12:50 AM  CHIEF COMPLAINT: Cough, wheezing  HPI: Pt is a 29 y.o. female who presents emergency department with cough that is nonproductive and wheezing for the past 2 days. No fever. No history of asthma or COPD. She is a nonsmoker. Denies history of influenza vaccination this year. No sick contacts.  ROS: See HPI Constitutional: no fever  Eyes: no drainage  ENT: no runny nose   Cardiovascular:  no chest pain  Resp: no SOB  GI: no vomiting GU: no dysuria Integumentary: no rash  Allergy: no hives  Musculoskeletal: no leg swelling  Neurological: no slurred speech ROS otherwise negative  PAST MEDICAL HISTORY/PAST SURGICAL HISTORY:  History reviewed. No pertinent past medical history.  MEDICATIONS:  Prior to Admission medications   Medication Sig Start Date End Date Taking? Authorizing Provider  acetaminophen (TYLENOL) 325 MG tablet Take 2 tablets (650 mg total) by mouth every 6 (six) hours as needed for mild pain. 12/09/14   Marily MemosJason Mesner, MD  ibuprofen (ADVIL,MOTRIN) 800 MG tablet Take 1 tablet (800 mg total) by mouth 3 (three) times daily. 11/04/14   Doug SouSam Jacubowitz, MD  omeprazole (PRILOSEC) 20 MG capsule Take 1 capsule (20 mg total) by mouth daily. 12/09/14   Marily MemosJason Mesner, MD  traMADol (ULTRAM) 50 MG tablet Take 1 tablet (50 mg total) by mouth every 6 (six) hours as needed. 12/09/14   Marily MemosJason Mesner, MD    ALLERGIES:  No Known Allergies  SOCIAL HISTORY:  Social History  Substance Use Topics  . Smoking status: Never Smoker   . Smokeless tobacco: Not on file  . Alcohol Use: No    FAMILY HISTORY: History reviewed. No pertinent family history.  EXAM: BP 126/83 mmHg  Pulse 71  Temp(Src) 98.4 F (36.9 C) (Oral)  Resp 16  Ht 5\' 6"  (1.676 m)  Wt 177 lb (80.287 kg)  BMI 28.58 kg/m2  SpO2 100%  LMP 12/09/2014 CONSTITUTIONAL: Alert and oriented and responds appropriately to questions. Well-appearing; well-nourished; afebrile, nontoxic HEAD: Normocephalic EYES:  Conjunctivae clear, PERRL ENT: normal nose; no rhinorrhea; moist mucous membranes; pharynx without lesions noted NECK: Supple, no meningismus, no LAD  CARD: RRR; S1 and S2 appreciated; no murmurs, no clicks, no rubs, no gallops RESP: Normal chest excursion without splinting or tachypnea; breath sounds clear and equal bilaterally; no wheezes, no rhonchi, no rales, no hypoxia or respiratory distress, speaking full sentences, no increased work of breathing ABD/GI: Normal bowel sounds; non-distended; soft, non-tender, no rebound, no guarding, no peritoneal signs BACK:  The back appears normal and is non-tender to palpation, there is no CVA tenderness EXT: Normal ROM in all joints; non-tender to palpation; no edema; normal capillary refill; no cyanosis, no calf tenderness or swelling    SKIN: Normal color for age and race; warm NEURO: Moves all extremities equally, sensation to light touch intact diffusely, cranial nerves II through XII intact PSYCH: The patient's mood and manner are appropriate. Grooming and personal hygiene are appropriate.  MEDICAL DECISION MAKING: Patient here with complaints of cough, wheezing. Afebrile, nontoxic, no respiratory distress, no hypoxia. Chest x-ray clear. Likely viral URI. Given reported history of wheezing we'll discharge with inhaler. Lungs are clear to auscultation currently. We'll discharge with Tessalon Perles and ibuprofen for symptomatic relief. I do not feel she needs antibiotics. Recommended outpatient follow-up as needed. Discussed return precautions. I do not feel she needs further emergent workup. She verbalized understanding and is comfortable with this plan.       Layla MawKristen N Ward, DO  01/08/15 0815 

## 2015-01-08 NOTE — Discharge Instructions (Signed)
Upper Respiratory Infection, Adult Most upper respiratory infections (URIs) are a viral infection of the air passages leading to the lungs. A URI affects the nose, throat, and upper air passages. The most common type of URI is nasopharyngitis and is typically referred to as "the common cold." URIs run their course and usually go away on their own. Most of the time, a URI does not require medical attention, but sometimes a bacterial infection in the upper airways can follow a viral infection. This is called a secondary infection. Sinus and middle ear infections are common types of secondary upper respiratory infections. Bacterial pneumonia can also complicate a URI. A URI can worsen asthma and chronic obstructive pulmonary disease (COPD). Sometimes, these complications can require emergency medical care and may be life threatening.  CAUSES Almost all URIs are caused by viruses. A virus is a type of germ and can spread from one person to another.  RISKS FACTORS You may be at risk for a URI if:   You smoke.   You have chronic heart or lung disease.  You have a weakened defense (immune) system.   You are very young or very old.   You have nasal allergies or asthma.  You work in crowded or poorly ventilated areas.  You work in health care facilities or schools. SIGNS AND SYMPTOMS  Symptoms typically develop 2-3 days after you come in contact with a cold virus. Most viral URIs last 7-10 days. However, viral URIs from the influenza virus (flu virus) can last 14-18 days and are typically more severe. Symptoms may include:   Runny or stuffy (congested) nose.   Sneezing.   Cough.   Sore throat.   Headache.   Fatigue.   Fever.   Loss of appetite.   Pain in your forehead, behind your eyes, and over your cheekbones (sinus pain).  Muscle aches.  DIAGNOSIS  Your health care provider may diagnose a URI by:  Physical exam.  Tests to check that your symptoms are not due to  another condition such as:  Strep throat.  Sinusitis.  Pneumonia.  Asthma. TREATMENT  A URI goes away on its own with time. It cannot be cured with medicines, but medicines may be prescribed or recommended to relieve symptoms. Medicines may help:  Reduce your fever.  Reduce your cough.  Relieve nasal congestion. HOME CARE INSTRUCTIONS   Take medicines only as directed by your health care provider.   Gargle warm saltwater or take cough drops to comfort your throat as directed by your health care provider.  Use a warm mist humidifier or inhale steam from a shower to increase air moisture. This may make it easier to breathe.  Drink enough fluid to keep your urine clear or pale yellow.   Eat soups and other clear broths and maintain good nutrition.   Rest as needed.   Return to work when your temperature has returned to normal or as your health care provider advises. You may need to stay home longer to avoid infecting others. You can also use a face mask and careful hand washing to prevent spread of the virus.  Increase the usage of your inhaler if you have asthma.   Do not use any tobacco products, including cigarettes, chewing tobacco, or electronic cigarettes. If you need help quitting, ask your health care provider. PREVENTION  The best way to protect yourself from getting a cold is to practice good hygiene.   Avoid oral or hand contact with people with cold   symptoms.   Wash your hands often if contact occurs.  There is no clear evidence that vitamin C, vitamin E, echinacea, or exercise reduces the chance of developing a cold. However, it is always recommended to get plenty of rest, exercise, and practice good nutrition.  SEEK MEDICAL CARE IF:   You are getting worse rather than better.   Your symptoms are not controlled by medicine.   You have chills.  You have worsening shortness of breath.  You have brown or red mucus.  You have yellow or brown nasal  discharge.  You have pain in your face, especially when you bend forward.  You have a fever.  You have swollen neck glands.  You have pain while swallowing.  You have white areas in the back of your throat. SEEK IMMEDIATE MEDICAL CARE IF:   You have severe or persistent:  Headache.  Ear pain.  Sinus pain.  Chest pain.  You have chronic lung disease and any of the following:  Wheezing.  Prolonged cough.  Coughing up blood.  A change in your usual mucus.  You have a stiff neck.  You have changes in your:  Vision.  Hearing.  Thinking.  Mood. MAKE SURE YOU:   Understand these instructions.  Will watch your condition.  Will get help right away if you are not doing well or get worse.   This information is not intended to replace advice given to you by your health care provider. Make sure you discuss any questions you have with your health care provider.   Document Released: 07/27/2000 Document Revised: 06/17/2014 Document Reviewed: 05/08/2013 Elsevier Interactive Patient Education 2016 [employer]  

## 2015-02-14 ENCOUNTER — Encounter (HOSPITAL_BASED_OUTPATIENT_CLINIC_OR_DEPARTMENT_OTHER): Payer: Self-pay | Admitting: *Deleted

## 2015-02-14 ENCOUNTER — Emergency Department (HOSPITAL_BASED_OUTPATIENT_CLINIC_OR_DEPARTMENT_OTHER)
Admission: EM | Admit: 2015-02-14 | Discharge: 2015-02-14 | Disposition: A | Discharge status: Home / Self Care | Payer: Medicaid - Out of State | Attending: Emergency Medicine | Admitting: Emergency Medicine

## 2015-02-14 DIAGNOSIS — Z3202 Encounter for pregnancy test, result negative: Secondary | ICD-10-CM | POA: Insufficient documentation

## 2015-02-14 DIAGNOSIS — N898 Other specified noninflammatory disorders of vagina: Secondary | ICD-10-CM | POA: Insufficient documentation

## 2015-02-14 DIAGNOSIS — R1030 Lower abdominal pain, unspecified: Secondary | ICD-10-CM | POA: Diagnosis present

## 2015-02-14 DIAGNOSIS — B349 Viral infection, unspecified: Secondary | ICD-10-CM

## 2015-02-14 DIAGNOSIS — Z79899 Other long term (current) drug therapy: Secondary | ICD-10-CM | POA: Diagnosis not present

## 2015-02-14 LAB — URINALYSIS, ROUTINE W REFLEX MICROSCOPIC
BILIRUBIN URINE: NEGATIVE
GLUCOSE, UA: NEGATIVE mg/dL
Hgb urine dipstick: NEGATIVE
KETONES UR: NEGATIVE mg/dL
Leukocytes, UA: NEGATIVE
Nitrite: NEGATIVE
PH: 5.5 (ref 5.0–8.0)
Protein, ur: NEGATIVE mg/dL
Specific Gravity, Urine: 1.01 (ref 1.005–1.030)

## 2015-02-14 LAB — WET PREP, GENITAL
Clue Cells Wet Prep HPF POC: NONE SEEN
SPERM: NONE SEEN
TRICH WET PREP: NONE SEEN
YEAST WET PREP: NONE SEEN

## 2015-02-14 LAB — PREGNANCY, URINE: Preg Test, Ur: NEGATIVE

## 2015-02-14 MED ORDER — ALBUTEROL SULFATE HFA 108 (90 BASE) MCG/ACT IN AERS
2.0000 | INHALATION_SPRAY | RESPIRATORY_TRACT | Status: DC | PRN
  Administered 2015-02-14: 2 via RESPIRATORY_TRACT
  Filled 2015-02-14: qty 6.7

## 2015-02-14 MED ORDER — ONDANSETRON 8 MG PO TBDP: 8.0000 mg | ORAL_TABLET | Freq: Three times a day (TID) | ORAL | Status: DC | PRN

## 2015-02-14 MED ORDER — IPRATROPIUM-ALBUTEROL 0.5-2.5 (3) MG/3ML IN SOLN
3.0000 mL | RESPIRATORY_TRACT | Status: DC
  Administered 2015-02-14: 3 mL via RESPIRATORY_TRACT
  Filled 2015-02-14: qty 3

## 2015-02-14 MED ORDER — SODIUM CHLORIDE 0.9 % IV BOLUS (SEPSIS)
1000.0000 mL | Freq: Once | INTRAVENOUS | Status: AC
  Administered 2015-02-14: 1000 mL via INTRAVENOUS

## 2015-02-14 MED ORDER — ONDANSETRON HCL 4 MG/2ML IJ SOLN
4.0000 mg | Freq: Once | INTRAMUSCULAR | Status: AC
  Administered 2015-02-14: 4 mg via INTRAVENOUS
  Filled 2015-02-14: qty 2

## 2015-02-14 NOTE — ED Notes (Signed)
Pt able to drink ginger ale 

## 2015-02-14 NOTE — ED Notes (Signed)
C/o abd pain x 3 days  w some n/v/d yesterday

## 2015-02-14 NOTE — Discharge Instructions (Signed)
Viral Infections °A viral infection can be caused by different types of viruses. Most viral infections are not serious and resolve on their own. However, some infections may cause severe symptoms and may lead to further complications. °SYMPTOMS °Viruses can frequently cause: °· Minor sore throat. °· Aches and pains. °· Headaches. °· Runny nose. °· Different types of rashes. °· Watery eyes. °· Tiredness. °· Cough. °· Loss of appetite. °· Gastrointestinal infections, resulting in nausea, vomiting, and diarrhea. °These symptoms do not respond to antibiotics because the infection is not caused by bacteria. However, you might catch a bacterial infection following the viral infection. This is sometimes called a "superinfection." Symptoms of such a bacterial infection may include: °· Worsening sore throat with pus and difficulty swallowing. °· Swollen neck glands. °· Chills and a high or persistent fever. °· Severe headache. °· Tenderness over the sinuses. °· Persistent overall ill feeling (malaise), muscle aches, and tiredness (fatigue). °· Persistent cough. °· Yellow, green, or brown mucus production with coughing. °HOME CARE INSTRUCTIONS  °· Only take over-the-counter or prescription medicines for pain, discomfort, diarrhea, or fever as directed by your caregiver. °· Drink enough water and fluids to keep your urine clear or pale yellow. Sports drinks can provide valuable electrolytes, sugars, and hydration. °· Get plenty of rest and maintain proper nutrition. Soups and broths with crackers or rice are fine. °SEEK IMMEDIATE MEDICAL CARE IF:  °· You have severe headaches, shortness of breath, chest pain, neck pain, or an unusual rash. °· You have uncontrolled vomiting, diarrhea, or you are unable to keep down fluids. °· You or your child has an oral temperature above 102° F (38.9° C), not controlled by medicine. °· Your baby is older than 3 months with a rectal temperature of 102° F (38.9° C) or higher. °· Your baby is 3  months old or younger with a rectal temperature of 100.4° F (38° C) or higher. °MAKE SURE YOU:  °· Understand these instructions. °· Will watch your condition. °· Will get help right away if you are not doing well or get worse. °  °This information is not intended to replace advice given to you by your health care provider. Make sure you discuss any questions you have with your health care provider. °  °Document Released: 11/10/2004 Document Revised: 04/25/2011 Document Reviewed: 07/09/2014 °Elsevier Interactive Patient Education ©2016 [employer] ° °

## 2015-02-14 NOTE — ED Notes (Signed)
abd pain onset 3 days ago ,  Nausea, vomiting diarrhea yesterday,  Denies burning w urination,  Some vag dc

## 2015-02-14 NOTE — ED Provider Notes (Signed)
CSN: [id number]     Arrival date & time 02/14/15  0346 History   First MD Initiated Contact with Patient 02/14/15 0400     Chief Complaint  Patient presents with  . Abdominal Pain     (Consider location/radiation/quality/duration/timing/severity/associated sxs/prior Treatment) HPI  This is a 29 year old female with a three-day history of nausea, vomiting and diarrhea. She is also having some lower abdominal pain that is mild. She has also had a sore throat, shortness of breath and vaginal discharge. She denies vaginal bleeding, dysuria or hematuria. She has not taken anything for her symptoms. She is not aware of having a fever. She states her symptoms feel like the last viral infection she had.   History reviewed. No pertinent past medical history. History reviewed. No pertinent past surgical history. No family history on file. Social History  Substance Use Topics  . Smoking status: Never Smoker   . Smokeless tobacco: None  . Alcohol Use: No   OB History    No data available     Review of Systems  All other systems reviewed and are negative.   Allergies  Review of patient's allergies indicates no known allergies.  Home Medications   Prior to Admission medications   Medication Sig Start Date End Date Taking? Authorizing Provider  albuterol (PROVENTIL HFA;VENTOLIN HFA) 108 (90 BASE) MCG/ACT inhaler Inhale 2 puffs into the lungs every 4 (four) hours as needed for wheezing or shortness of breath. 01/08/15   Kristen N Ward, DO  benzonatate (TESSALON) 100 MG capsule Take 1 capsule (100 mg total) by mouth 3 (three) times daily as needed for cough. 01/08/15   Kristen N Ward, DO  ibuprofen (ADVIL,MOTRIN) 800 MG tablet Take 1 tablet (800 mg total) by mouth every 8 (eight) hours as needed for mild pain. 01/08/15   Kristen N Ward, DO   BP 130/86 mmHg  Pulse 81  Temp(Src) 97.4 F (36.3 C) (Oral)  Resp 18  Ht 5\' 6"  (1.676 m)  Wt 186 lb (84.369 kg)  BMI 30.04 kg/m2  SpO2 100%    Physical Exam  General: Well-developed, well-nourished female in no acute distress; appearance consistent with age of record HENT: normocephalic; atraumatic; pharynx normal Eyes: pupils equal, round and reactive to light; extraocular muscles intact Neck: supple Heart: regular rate and rhythm Lungs: Decreased air movement bilaterally without wheezing Abdomen: soft; nondistended; mild suprapubic tenderness; no masses or hepatosplenomegaly; bowel sounds present GU: Normal external genitalia; no vaginal bleeding; mucoid discharge per cervical os; no cervical motion tenderness; no adnexal tenderness Extremities: No deformity; full range of motion; pulses normal Neurologic: Awake, alert and oriented; motor function intact in all extremities and symmetric; no facial droop Skin: Warm and dry Psychiatric: Flat affect    ED Course  Procedures (including critical care time)   MDM  Nursing notes and vitals signs, including pulse oximetry, reviewed.  Summary of this visit's results, reviewed by myself:  Labs:  Results for orders placed or performed during the hospital encounter of 02/14/15 (from the past 24 hour(s))  Urinalysis, Routine w reflex microscopic (not at [facility])     Status: None   Collection Time: 02/14/15  4:20 AM  Result Value Ref Range   Color, Urine YELLOW YELLOW   APPearance CLEAR CLEAR   Specific Gravity, Urine 1.010 1.005 - 1.030   pH 5.5 5.0 - 8.0   Glucose, UA NEGATIVE NEGATIVE mg/dL   Hgb urine dipstick NEGATIVE NEGATIVE   Bilirubin Urine NEGATIVE NEGATIVE   Ketones, ur NEGATIVE  NEGATIVE mg/dL   Protein, ur NEGATIVE NEGATIVE mg/dL   Nitrite NEGATIVE NEGATIVE   Leukocytes, UA NEGATIVE NEGATIVE  Pregnancy, urine     Status: None   Collection Time: 02/14/15  4:20 AM  Result Value Ref Range   Preg Test, Ur NEGATIVE NEGATIVE  Wet prep, genital     Status: Abnormal   Collection Time: 02/14/15  4:25 AM  Result Value Ref Range   Yeast Wet Prep HPF POC NONE SEEN NONE  SEEN   Trich, Wet Prep NONE SEEN NONE SEEN   Clue Cells Wet Prep HPF POC NONE SEEN NONE SEEN   WBC, Wet Prep HPF POC MANY (A) NONE SEEN   Sperm NONE SEEN    4:45 AM Increased air movement after DuoNeb treatment.  5:37 AM Feeling better. Able to drink fluids without emesis.  Paula Libra, MD 02/14/15 [phone]

## 2015-02-17 LAB — GC/CHLAMYDIA PROBE AMP (~~LOC~~) NOT AT ARMC
Chlamydia: NEGATIVE
Neisseria Gonorrhea: NEGATIVE

## 2015-03-09 ENCOUNTER — Encounter (HOSPITAL_BASED_OUTPATIENT_CLINIC_OR_DEPARTMENT_OTHER): Payer: Self-pay

## 2015-03-09 ENCOUNTER — Other Ambulatory Visit: Payer: Self-pay

## 2015-03-09 ENCOUNTER — Emergency Department (HOSPITAL_BASED_OUTPATIENT_CLINIC_OR_DEPARTMENT_OTHER): Payer: Medicaid - Out of State

## 2015-03-09 ENCOUNTER — Emergency Department (HOSPITAL_BASED_OUTPATIENT_CLINIC_OR_DEPARTMENT_OTHER)
Admission: EM | Admit: 2015-03-09 | Discharge: 2015-03-09 | Disposition: A | Discharge status: Home / Self Care | Payer: Medicaid - Out of State | Attending: Emergency Medicine | Admitting: Emergency Medicine

## 2015-03-09 DIAGNOSIS — R0602 Shortness of breath: Secondary | ICD-10-CM | POA: Insufficient documentation

## 2015-03-09 DIAGNOSIS — M7989 Other specified soft tissue disorders: Secondary | ICD-10-CM | POA: Insufficient documentation

## 2015-03-09 DIAGNOSIS — R42 Dizziness and giddiness: Secondary | ICD-10-CM | POA: Diagnosis not present

## 2015-03-09 DIAGNOSIS — R079 Chest pain, unspecified: Secondary | ICD-10-CM

## 2015-03-09 LAB — CBC
HEMATOCRIT: 35.9 % — AB (ref 36.0–46.0)
Hemoglobin: 11.3 g/dL — ABNORMAL LOW (ref 12.0–15.0)
MCH: 26.1 pg (ref 26.0–34.0)
MCHC: 31.5 g/dL (ref 30.0–36.0)
MCV: 82.9 fL (ref 78.0–100.0)
Platelets: 343 10*3/uL (ref 150–400)
RBC: 4.33 MIL/uL (ref 3.87–5.11)
RDW: 12.5 % (ref 11.5–15.5)
WBC: 5.6 10*3/uL (ref 4.0–10.5)

## 2015-03-09 LAB — BASIC METABOLIC PANEL
ANION GAP: 7 (ref 5–15)
BUN: 10 mg/dL (ref 6–20)
CO2: 24 mmol/L (ref 22–32)
Calcium: 8.9 mg/dL (ref 8.9–10.3)
Chloride: 107 mmol/L (ref 101–111)
Creatinine, Ser: 0.82 mg/dL (ref 0.44–1.00)
GFR calc Af Amer: >60 mL/min (ref >60)
GFR calc non Af Amer: >60 mL/min (ref >60)
GLUCOSE: 159 mg/dL — AB (ref 65–99)
Potassium: 3.5 mmol/L (ref 3.5–5.1)
Sodium: 138 mmol/L (ref 135–145)

## 2015-03-09 LAB — TROPONIN I: Troponin I: <0.03 ng/mL (ref <0.031)

## 2015-03-09 MED ORDER — IBUPROFEN 800 MG PO TABS
800.0000 mg | ORAL_TABLET | Freq: Once | ORAL | Status: AC
  Administered 2015-03-09: 800 mg via ORAL
  Filled 2015-03-09: qty 1

## 2015-03-09 MED ORDER — IBUPROFEN 800 MG PO TABS: 800.0000 mg | ORAL_TABLET | Freq: Three times a day (TID) | ORAL | Status: DC

## 2015-03-09 NOTE — ED Provider Notes (Signed)
CSN: [id number]     Arrival date & time 03/09/15  1500 History  By signing my name below, I, Murriel Hopper, attest that this documentation has been prepared under the direction and in the presence of Jerelyn Scott, MD. Electronically Signed: Murriel Hopper, ED Scribe. 03/09/2015. 3:38 PM.    Chief Complaint  Patient presents with  . Chest Pain      Patient is a 30 y.o. female presenting with chest pain. The history is provided by the patient. No language interpreter was used.  Chest Pain Pain location:  Substernal area Pain quality: sharp   Pain radiates to:  Does not radiate Pain radiates to the back: no   Pain severity:  Mild Onset quality:  Gradual Duration:  8 hours Timing:  Constant Progression:  Improving Chronicity:  New Context: at rest   Relieved by:  None tried Worsened by:  Nothing tried Ineffective treatments:  None tried Associated symptoms: dizziness, lower extremity edema and shortness of breath   Associated symptoms: no cough and no fever    HPI Comments: Brooke Silva is a 30 y.o. female who presents to the Emergency Department complaining of constant, improving central chest pain that has been present since this morning. Pt reports that she stayed home from work because she was having sharp, constant pain, but reports that at this time her pain has subsided some, and is more mild. Pt also reports having associated SOB, lightheadedness, and intermittent leg swelling. Pt states her leg swelling may be from her job, as she is on her feet all day. Pt also reports her SOB does not change when she breathes. Pt states she has not taken any medication to treat her pain prior to coming to ED. Pt denies a hx of blood clots, denies any recent travel, and states she does not take birth control. Pt denies fever or coughing.     History reviewed. No pertinent past medical history. History reviewed. No pertinent past surgical history. No family history on file. Social  History  Substance Use Topics  . Smoking status: Never Smoker   . Smokeless tobacco: None  . Alcohol Use: No   OB History    No data available     Review of Systems  Constitutional: Negative for fever.  Respiratory: Positive for shortness of breath. Negative for cough.   Cardiovascular: Positive for chest pain.  Neurological: Positive for dizziness.    A complete 10 system review of systems was obtained and all systems are negative except as noted in the HPI and PMH.    Allergies  Review of patient's allergies indicates no known allergies.  Home Medications   Prior to Admission medications   Medication Sig Start Date End Date Taking? Authorizing Provider  ibuprofen (ADVIL,MOTRIN) 800 MG tablet Take 1 tablet (800 mg total) by mouth 3 (three) times daily. 03/09/15   Jerelyn Scott, MD   BP 112/69 mmHg  Pulse 66  Temp(Src) 98.4 F (36.9 C) (Oral)  Resp 18  Ht  (1.676 m)  Wt 186 lb (84.369 kg)  BMI 30.04 kg/m2  SpO2 99%  LMP 03/05/2015  Vitals reviewed Physical Exam  Physical Examination: General appearance - alert, well appearing, and in no distress Mental status - alert, oriented to person, place, and time Eyes - no conjunctival injection no scleral icterus Mouth - mucous membranes moist, pharynx normal without lesions Chest - clear to auscultation, no wheezes, rales or rhonchi, symmetric air entry Heart - normal rate, regular rhythm, normal S1,  S2, no murmurs, rubs, clicks or gallops Abdomen - soft, nontender, nondistended, no masses or organomegaly Neurological - alert, oriented, normal speech Extremities - peripheral pulses normal, no pedal edema, no clubbing or cyanosis Skin - normal coloration and turgor, no rashes  ED Course  Procedures (including critical care time)  DIAGNOSTIC STUDIES: Oxygen Saturation is 100% on room air, normal by my interpretation.    COORDINATION OF CARE: 3:32 PM Discussed treatment plan with pt at bedside and pt agreed to  plan.   Labs Review Labs Reviewed  CBC - Abnormal; Notable for the following:    Hemoglobin 11.3 (*)    HCT 35.9 (*)    All other components within normal limits  BASIC METABOLIC PANEL - Abnormal; Notable for the following:    Glucose, Bld 159 (*)    All other components within normal limits  TROPONIN I    Imaging Review Dg Chest 2 View  03/09/2015  CLINICAL DATA:  New cough for 2 weeks, shortness of breath, intermittent mid chest pain worse today EXAM: CHEST  2 VIEW COMPARISON:  01/08/2015 FINDINGS: Normal heart size, mediastinal contours, and pulmonary vascularity. Lungs clear. No pneumothorax. Bones unremarkable. IMPRESSION: Normal exam. Electronically Signed   By: Ulyses Southward M.D.   On: 03/09/2015 15:49   I have personally reviewed and evaluated these images and lab results as part of my medical decision-making.   EKG Interpretation   Date/Time:  Monday March 09 2015 15:08:30 EST Ventricular Rate:  66 PR Interval:  160 QRS Duration: 76 QT Interval:  382 QTC Calculation: 400 R Axis:   70 Text Interpretation:  Normal sinus rhythm Nonspecific ST abnormality  Abnormal ECG No old tracing to compare Confirmed by [facility]  MD, Dionisios Ricci  [phone]) on 03/09/2015 3:24:39 PM      MDM   Final diagnoses:  Chest pain, unspecified chest pain type    Pt presenting with constant sharp chest pain that has been ongoing since this morning.  EKg reassuring, CXR reassuring as well as labs. Pt is a heart score of 0.  Perc negative as well, very low risk for PE.  As labs and workup are reassuring and patient had recent bronchitis suspect inflammation as cause for discomfort.  Discharged with strict return precautions.  Pt agreeable with plan.  I personally performed the services described in this documentation, which was scribed in my presence. The recorded information has been reviewed and is accurate.     Jerelyn Scott, MD 03/09/15 [phone]

## 2015-03-09 NOTE — Discharge Instructions (Signed)
Return to the ED with any concerns including difficulty breathing, worsening pain, fainting, decreased level of alertness/lethargy, or any other alarming symptoms °

## 2015-03-09 NOTE — ED Notes (Signed)
CP x today

## 2015-03-12 ENCOUNTER — Emergency Department (HOSPITAL_BASED_OUTPATIENT_CLINIC_OR_DEPARTMENT_OTHER)
Admission: EM | Admit: 2015-03-12 | Discharge: 2015-03-12 | Disposition: A | Discharge status: Home / Self Care | Payer: Medicaid - Out of State | Attending: Emergency Medicine | Admitting: Emergency Medicine

## 2015-03-12 ENCOUNTER — Encounter (HOSPITAL_BASED_OUTPATIENT_CLINIC_OR_DEPARTMENT_OTHER): Payer: Self-pay | Admitting: *Deleted

## 2015-03-12 DIAGNOSIS — R0602 Shortness of breath: Secondary | ICD-10-CM | POA: Diagnosis not present

## 2015-03-12 DIAGNOSIS — R42 Dizziness and giddiness: Secondary | ICD-10-CM | POA: Insufficient documentation

## 2015-03-12 DIAGNOSIS — R0981 Nasal congestion: Secondary | ICD-10-CM | POA: Insufficient documentation

## 2015-03-12 DIAGNOSIS — R079 Chest pain, unspecified: Secondary | ICD-10-CM | POA: Diagnosis not present

## 2015-03-12 DIAGNOSIS — Z791 Long term (current) use of non-steroidal anti-inflammatories (NSAID): Secondary | ICD-10-CM | POA: Diagnosis not present

## 2015-03-12 DIAGNOSIS — J029 Acute pharyngitis, unspecified: Secondary | ICD-10-CM | POA: Insufficient documentation

## 2015-03-12 LAB — TROPONIN I

## 2015-03-12 NOTE — ED Provider Notes (Signed)
CSN: [id number]     Arrival date & time 03/12/15  1747 History  By signing my name below, I, Tanda Rockers, attest that this documentation has been prepared under the direction and in the presence of Alvira Monday, MD. Electronically Signed: Tanda Rockers, ED Scribe. 03/12/2015. 7:38 PM.   Chief Complaint  Patient presents with  . Chest Pain   Patient is a 31 y.o. female presenting with chest pain. The history is provided by the patient. No language interpreter was used.  Chest Pain Pain location:  Substernal area Pain quality: pressure   Pain radiates to:  Does not radiate Pain radiates to the back: no   Pain severity:  Moderate Onset quality:  Gradual Duration:  12 hours Timing:  Constant Chronicity:  New Associated symptoms: shortness of breath   Associated symptoms: no abdominal pain, no back pain, no cough, no diaphoresis, no fever, no headache, no nausea and not vomiting   Risk factors: no birth control, no immobilization, not female, no prior DVT/PE, no smoking and no surgery      HPI Comments: Brooke Silva is a 30 y.o. female who presents to the Emergency Department complaining of gradual onset, constant, 5/10, mid sternal chest pressure that began this morning. Pt mentions having sharp mid sternal chest pain and shortness of breath for the past 4 days. Pt was seen in the ED on 03/09/2015 (approximately 4 days ago) for the chest pain and shortness of breath. She had an EKG, blood work, and a CXR done with no acute findings and was discharged home. Pt returned today due to the pressure in her chest being new. There are no alleviating factors to the pain. She also complains of lightheadedness, nasal congestion, and a scratchy throat. She mentions intermittent leg swelling from being on her feet for prolonged periods of time at work. Pt does not currently have leg swelling. Denies nausea, diaphoresis, cough, fever, abdominal pain, diarrhea, or any other associated symptoms. PT is  not currently on BC pills. No recent surgery or prolonged travel. No FHx cardiac issues. Pt is non smoker. No hx DVT/PE.   History reviewed. No pertinent past medical history. History reviewed. No pertinent past surgical history. No family history on file. Social History  Substance Use Topics  . Smoking status: Never Smoker   . Smokeless tobacco: None  . Alcohol Use: No   OB History    No data available     Review of Systems  Constitutional: Negative for fever and diaphoresis.  HENT: Positive for congestion and sore throat ([place]atch[place]).   Eyes: Negative for visual disturbance.  Respiratory: Positive for shortness of breath. Negative for cough.   Cardiovascular: Positive for chest pain. Negative for leg swelling.  Gastrointestinal: Negative for nausea, vomiting and abdominal pain.  Genitourinary: Negative for difficulty urinating.  Musculoskeletal: Negative for back pain and neck pain.  Skin: Negative for rash.  Neurological: Positive for light-headedness. Negative for syncope and headaches.    Allergies  Review of patient's allergies indicates no known allergies.  Home Medications   Prior to Admission medications   Medication Sig Start Date End Date Taking? Authorizing Provider  ibuprofen (ADVIL,MOTRIN) 800 MG tablet Take 1 tablet (800 mg total) by mouth 3 (three) times daily. 03/09/15   Jerelyn Scott, MD   BP 128/74 mmHg  Pulse 74  Temp(Src) 98.2 F (36.8 C)  Resp 18  Ht  (1.676 m)  Wt 186 lb (84.369 kg)  BMI 30.04 kg/m2  SpO2 100%  LMP  03/05/2015   Physical Exam  Constitutional: She is oriented to person, place, and time. She appears well-developed and well-nourished. No distress.  HENT:  Head: Normocephalic and atraumatic.  Eyes: Conjunctivae and EOM are normal.  Neck: Neck supple. No tracheal deviation present.  Cardiovascular: Normal rate, regular rhythm and normal heart sounds.   No murmur heard. Pulmonary/Chest: Effort normal and breath sounds normal.  No respiratory distress. She has no wheezes. She has no rhonchi. She has no rales.  Abdominal: Soft. There is no tenderness.  Musculoskeletal: Normal range of motion.  Neurological: She is alert and oriented to person, place, and time.  Skin: Skin is warm and dry.  Psychiatric: She has a normal mood and affect. Her behavior is normal.  Nursing note and vitals reviewed.   ED Course  Procedures (including critical care time)  DIAGNOSTIC STUDIES: Oxygen Saturation is 100% on RA, normal by my interpretation.    COORDINATION OF CARE: 7:27 PM-Discussed treatment plan which includes Troponin with pt at bedside and pt agreed to plan.   Labs Review Labs Reviewed  TROPONIN I    Imaging Review No results found. I have personally reviewed and evaluated these lab results as part of my medical decision-making.   EKG Interpretation   Date/Time:  Thursday March 12 2015 18:02:52 EST Ventricular Rate:  70 PR Interval:  162 QRS Duration: 80 QT Interval:  390 QTC Calculation: 421 R Axis:   56 Text Interpretation:  Normal sinus rhythm Normal ECG No significant change  since last tracing Confirmed by [facility] MD, Jaheim Canino ([id number]) on 03/12/2015  7:25:38 PM      MDM   Final diagnoses:  Chest pain, unspecified chest pain type   30 year old female with no significant medical history history presents with concern of chest pain. Seen days ago for same with negative work up. Differential diagnosis for chest pain includes pulmonary embolus, dissection, pneumothorax, pneumonia, ACS, myocarditis, pericarditis.  EKG was done and evaluate by me and showed no acute ST changes and no signs of pericarditis. Chest x-ray was done 2 days ago and showed no sign of pneumonia or pneumothorax and doubt any changes since that time. Patient is PERC negative and low risk [place] and have low suspicion for PE, discussed with pt that these decision tools are not 100% sensitive, however have low suspicion and pt states  understanding. Patient is low risk HEART score, had troponin completed days ago, and given change in quality of CP, obtained troponin today which was negative. Given constant pain since this AM, do not feel delta troponin is indicated. Given this evaluation, history and physical have low suspicion for pulmonary embolus, pneumonia, ACS, myocarditis, pericarditis, dissection.   Discussed results with pt who is comfortable with plan and has obtained PCP follow up. Patient discharged in stable condition with understanding of reasons to return.   I personally performed the services described in this documentation, which was scribed in my presence. The recorded information has been reviewed and is accurate.   Alvira Monday, MD 03/13/15 1158

## 2015-03-12 NOTE — ED Notes (Signed)
Chest pressure and SOB. She was seen 2 days ago for the same. She is now having pressure.

## 2015-05-15 ENCOUNTER — Ambulatory Visit (INDEPENDENT_AMBULATORY_CARE_PROVIDER_SITE_OTHER): Admitting: Family Medicine

## 2015-05-15 ENCOUNTER — Ambulatory Visit (HOSPITAL_BASED_OUTPATIENT_CLINIC_OR_DEPARTMENT_OTHER)
Admission: RE | Admit: 2015-05-15 | Discharge: 2015-05-15 | Disposition: A | Discharge status: Home / Self Care | Source: Ambulatory Visit | Attending: Family Medicine | Admitting: Family Medicine

## 2015-05-15 ENCOUNTER — Encounter: Payer: Self-pay | Admitting: Family Medicine

## 2015-05-15 ENCOUNTER — Ambulatory Visit: Admitting: Family Medicine

## 2015-05-15 VITALS — BP 120/83 | HR 73 | Ht 66.0 in | Wt 190.0 lb

## 2015-05-15 DIAGNOSIS — S6991XA Unspecified injury of right wrist, hand and finger(s), initial encounter: Secondary | ICD-10-CM

## 2015-05-15 DIAGNOSIS — M545 Low back pain, unspecified: Secondary | ICD-10-CM

## 2015-05-15 DIAGNOSIS — S4991XA Unspecified injury of right shoulder and upper arm, initial encounter: Secondary | ICD-10-CM

## 2015-05-15 DIAGNOSIS — M25531 Pain in right wrist: Secondary | ICD-10-CM | POA: Insufficient documentation

## 2015-05-15 DIAGNOSIS — W19XXXA Unspecified fall, initial encounter: Secondary | ICD-10-CM | POA: Insufficient documentation

## 2015-05-15 MED ORDER — METHOCARBAMOL 500 MG PO TABS: 500.0000 mg | ORAL_TABLET | Freq: Three times a day (TID) | ORAL | Status: DC | PRN

## 2015-05-15 MED ORDER — MELOXICAM 15 MG PO TABS: 15.0000 mg | ORAL_TABLET | Freq: Every day | ORAL | Status: DC

## 2015-05-15 NOTE — Patient Instructions (Addendum)
We will try to get the records from the doctor you saw in [place]. Get x-rays of your wrist today downstairs. Wear wrist brace as often as possible until I see you back. Ok to take this off to ice the area 15 minutes at a time 3-4 times a day and to wash it. Try meloxicam 15mg  daily with food. Robaxin as needed for muscle spasms. Start physical therapy for lumbar, thoracic strains and wrist sprain. If x-rays look good and you're not improving at follow up in 1 month I'm going to recommend we go ahead with an MRI of your wrist.

## 2015-05-18 ENCOUNTER — Telehealth: Payer: Self-pay | Admitting: *Deleted

## 2015-05-18 ENCOUNTER — Telehealth: Payer: Self-pay | Admitting: Family Medicine

## 2015-05-18 ENCOUNTER — Encounter: Payer: Self-pay | Admitting: Family Medicine

## 2015-05-18 DIAGNOSIS — S4991XA Unspecified injury of right shoulder and upper arm, initial encounter: Secondary | ICD-10-CM | POA: Insufficient documentation

## 2015-05-18 DIAGNOSIS — M545 Low back pain, unspecified: Secondary | ICD-10-CM | POA: Insufficient documentation

## 2015-05-18 DIAGNOSIS — S6991XA Unspecified injury of right wrist, hand and finger(s), initial encounter: Secondary | ICD-10-CM | POA: Insufficient documentation

## 2015-05-18 NOTE — Telephone Encounter (Signed)
I would probably need more details on what details they want.  The pushing and pulling restrictions are the same as the lifting restrictions - the weight limits are included on the form.

## 2015-05-18 NOTE — Assessment & Plan Note (Signed)
FOOSH mechanism with persistent dorsal pain.  No snuffbox tenderness which is reassuring.  2/2 sprain. Independently reviewed radiographs and these are reassuring.  Start with wrist brace, icing, meloxicam with robaxin as needed.

## 2015-05-18 NOTE — Telephone Encounter (Signed)
Note printed and placed up front. 

## 2015-05-18 NOTE — Progress Notes (Signed)
PCP: No PCP Per Patient  Subjective:   HPI: Patient is a 30 y.o. female here for multiple injuries.  Patient reports her initial injury to right shoulder, low back, and wrist occurred on 09/12/2014 when at work she tripped over some equipment, sustained FOOSH injury to right wrist. She had also twisted her mid to low back and landed onto right shoulder. Saw physician for these - told she had wrist and shoulder sprains - told to do physical therapy but out of pocket cost was too great. She has continued to have pain in right shoulder at 7/10 level, right wrist 7/10 - sharp, low back 5/10. Some tingling in right hand all digits. Hard to grasp items with this hand. Does not have records with her from physician she saw in [place] - unable to pull these up in Care Everywhere. No bowel/bladder dysfunction. No skin changes, fever, other complaints  No past medical history on file.  Current Outpatient Prescriptions on File Prior to Visit  Medication Sig Dispense Refill  . [DISCONTINUED] omeprazole (PRILOSEC) 20 MG capsule Take 1 capsule (20 mg total) by mouth daily. 30 capsule 0   No current facility-administered medications on file prior to visit.    No past surgical history on file.  No Known Allergies  Social History   Social History  . Marital Status: Single    Spouse Name: N/A  . Number of Children: N/A  . Years of Education: N/A   Occupational History  . Not on file.   Social History Main Topics  . Smoking status: Never Smoker   . Smokeless tobacco: Not on file  . Alcohol Use: No  . Drug Use: No  . Sexual Activity: Yes    Birth Control/ Protection: None   Other Topics Concern  . Not on file   Social History Narrative    No family history on file.  BP 120/83 mmHg  Pulse 73  Ht 5\' 6"  (1.676 m)  Wt 190 lb (86.183 kg)  BMI 30.68 kg/m2  LMP 05/01/2015  Review of Systems: See HPI above.    Objective:  Physical Exam:  Gen: NAD, comfortable in exam  room  Right shoulder: No swelling, ecchymoses.  No gross deformity. TTP right thoracic paraspinal region FROM. Negative Hawkins, Neers. Negative Speeds, Yergasons. Strength 5/5 with empty can and resisted internal/external rotation. Negative apprehension. NV intact distally.  Right wrist: No gross deformity, swelling, bruising. TTP dorsal wrist joint - no snuffbox or other bony tenderness. FROM with pain on dorsiflexion. Negative tinels carpal tunnel. NVI distally currently.  Back: No gross deformity, scoliosis. TTP R > L paraspinal low thoracic and lumba rregions.  No midline or bony TTP. FROM. Strength LEs 5/5 all muscle groups.   2+ MSRs in patellar and achilles tendons, equal bilaterally. Negative SLRs. Sensation intact to light touch bilaterally. Negative logroll bilateral hips Negative fabers and piriformis stretches.     Assessment & Plan:  1. Right shoulder injury - consistent with thoracic strain.  Shoulder exam otherwise benign, reassured.  Meloxicam with robaxin as needed.  Physical therapy and home exercises.  2. Right wrist injury - FOOSH mechanism with persistent dorsal pain.  No snuffbox tenderness which is reassuring.  2/2 sprain. Independently reviewed radiographs and these are reassuring.  Start with wrist brace, icing, meloxicam with robaxin as needed.  3.  Low back pain - 2/2 thoracic and lumbar strains.  Start physical therapy, meloxicam with robaxin as needed.  F/u in 1 month.

## 2015-05-18 NOTE — Assessment & Plan Note (Signed)
2/2 thoracic and lumbar strains.  Start physical therapy, meloxicam with robaxin as needed.  F/u in 1 month.

## 2015-05-18 NOTE — Telephone Encounter (Signed)
Spoke to patient and told her that letter was ready to be picked up

## 2015-05-18 NOTE — Assessment & Plan Note (Signed)
consistent with thoracic strain.  Shoulder exam otherwise benign, reassured.  Meloxicam with robaxin as needed.  Physical therapy and home exercises.

## 2015-05-18 NOTE — Telephone Encounter (Signed)
Spoke to patient and she stated that she needs a letter that states how many pounds she can push, how many pounds she can pull and how many pounds she can lift.

## 2015-06-03 ENCOUNTER — Encounter (HOSPITAL_BASED_OUTPATIENT_CLINIC_OR_DEPARTMENT_OTHER): Payer: Self-pay | Admitting: *Deleted

## 2015-06-03 DIAGNOSIS — N898 Other specified noninflammatory disorders of vagina: Secondary | ICD-10-CM | POA: Insufficient documentation

## 2015-06-03 NOTE — ED Notes (Signed)
Pt c/o vaginal pain and lesions x 1 day

## 2015-06-04 ENCOUNTER — Emergency Department (HOSPITAL_BASED_OUTPATIENT_CLINIC_OR_DEPARTMENT_OTHER)
Admission: EM | Admit: 2015-06-04 | Discharge: 2015-06-04 | Disposition: A | Discharge status: Home / Self Care | Payer: Medicaid - Out of State | Attending: Emergency Medicine | Admitting: Emergency Medicine

## 2015-06-04 DIAGNOSIS — N760 Acute vaginitis: Secondary | ICD-10-CM

## 2015-06-04 DIAGNOSIS — N390 Urinary tract infection, site not specified: Secondary | ICD-10-CM

## 2015-06-04 DIAGNOSIS — B9689 Other specified bacterial agents as the cause of diseases classified elsewhere: Secondary | ICD-10-CM

## 2015-06-04 LAB — GC/CHLAMYDIA PROBE AMP (~~LOC~~) NOT AT ARMC
Chlamydia: POSITIVE — AB
NEISSERIA GONORRHEA: NEGATIVE

## 2015-06-04 LAB — URINALYSIS, ROUTINE W REFLEX MICROSCOPIC
BILIRUBIN URINE: NEGATIVE
GLUCOSE, UA: NEGATIVE mg/dL
KETONES UR: NEGATIVE mg/dL
Nitrite: NEGATIVE
PH: 7 (ref 5.0–8.0)
PROTEIN: NEGATIVE mg/dL
Specific Gravity, Urine: 1.018 (ref 1.005–1.030)

## 2015-06-04 LAB — WET PREP, GENITAL
SPERM: NONE SEEN
Trich, Wet Prep: NONE SEEN
Yeast Wet Prep HPF POC: NONE SEEN

## 2015-06-04 LAB — URINE MICROSCOPIC-ADD ON

## 2015-06-04 LAB — PREGNANCY, URINE: Preg Test, Ur: NEGATIVE

## 2015-06-04 MED ORDER — FOSFOMYCIN TROMETHAMINE 3 G PO PACK
3.0000 g | PACK | Freq: Once | ORAL | Status: AC
  Administered 2015-06-04: 3 g via ORAL
  Filled 2015-06-04: qty 3

## 2015-06-04 MED ORDER — METRONIDAZOLE 0.75 % VA GEL: VAGINAL | Status: DC

## 2015-06-04 NOTE — ED Provider Notes (Signed)
CSN: [id number]     Arrival date & time 06/03/15  2353 History   First MD Initiated Contact with Patient 06/04/15 0256     Chief Complaint  Patient presents with  . Vaginal Pain     (Consider location/radiation/quality/duration/timing/severity/associated sxs/prior Treatment) HPI  This is a 30 year old female with a two-day history of "bumps" on her labia majora. There is associated discomfort but no frank pain. There is no pain with urination. She does have a vaginal discharge which she thinks may be due to bacterial vaginosis. She denies vaginal bleeding, fever, chills, nausea, vomiting or diarrhea.  History reviewed. No pertinent past medical history. History reviewed. No pertinent past surgical history. History reviewed. No pertinent family history. Social History  Substance Use Topics  . Smoking status: Never Smoker   . Smokeless tobacco: None  . Alcohol Use: No   OB History    No data available     Review of Systems  All other systems reviewed and are negative.   Allergies  Review of patient's allergies indicates no known allergies.  Home Medications   Prior to Admission medications   Not on File   BP 113/83 mmHg  Pulse 75  Temp(Src) 98.3 F (36.8 C)  Resp 16  Ht 5\' 6"  (1.676 m)  Wt 183 lb (83.008 kg)  BMI 29.55 kg/m2  SpO2 100%  LMP 04/23/2015   Physical Exam  General: Well-developed, well-nourished female in no acute distress; appearance consistent with age of record HENT: normocephalic; atraumatic Eyes: pupils equal, round and reactive to light; extraocular muscles intact Neck: supple Heart: regular rate and rhythm Lungs: clear to auscultation bilaterally Abdomen: soft; nondistended; nontender; no masses or hepatosplenomegaly; bowel sounds present GU: Normal external genitalia; white vaginal discharge; no vaginal bleeding; no cervical motion tenderness; no adnexal tenderness; shaved pubic hair with what appears to be razor bumps Extremities: No  deformity; full range of motion; pulses normal Neurologic: Awake, alert and oriented; motor function intact in all extremities and symmetric; no facial droop Skin: Warm and dry Psychiatric: Normal mood and affect    ED Course  Procedures (including critical care time)   MDM  Nursing notes and vitals signs, including pulse oximetry, reviewed.  Summary of this visit's results, reviewed by myself:  Labs:  Results for orders placed or performed during the hospital encounter of 06/04/15 (from the past 24 hour(s))  Urinalysis, Routine w reflex microscopic (not at [facility])     Status: Abnormal   Collection Time: 06/04/15  3:00 AM  Result Value Ref Range   Color, Urine YELLOW YELLOW   APPearance CLEAR CLEAR   Specific Gravity, Urine 1.018 1.005 - 1.030   pH 7.0 5.0 - 8.0   Glucose, UA NEGATIVE NEGATIVE mg/dL   Hgb urine dipstick TRACE (A) NEGATIVE   Bilirubin Urine NEGATIVE NEGATIVE   Ketones, ur NEGATIVE NEGATIVE mg/dL   Protein, ur NEGATIVE NEGATIVE mg/dL   Nitrite NEGATIVE NEGATIVE   Leukocytes, UA SMALL (A) NEGATIVE  Pregnancy, urine     Status: None   Collection Time: 06/04/15  3:00 AM  Result Value Ref Range   Preg Test, Ur NEGATIVE NEGATIVE  Urine microscopic-add on     Status: Abnormal   Collection Time: 06/04/15  3:00 AM  Result Value Ref Range   Squamous Epithelial / LPF 0-5 (A) NONE SEEN   WBC, UA 6-30 0 - 5 WBC/hpf   RBC / HPF 0-5 0 - 5 RBC/hpf   Bacteria, UA FEW (A) NONE SEEN  Wet  prep, genital     Status: Abnormal   Collection Time: 06/04/15  3:10 AM  Result Value Ref Range   Yeast Wet Prep HPF POC NONE SEEN NONE SEEN   Trich, Wet Prep NONE SEEN NONE SEEN   Clue Cells Wet Prep HPF POC PRESENT (A) NONE SEEN   WBC, Wet Prep HPF POC MANY (A) NONE SEEN   Sperm NONE SEEN    Will treat for UTI and BV.       Paula Libra, MD 06/04/15 [phone]

## 2015-06-04 NOTE — Discharge Instructions (Signed)

## 2015-06-04 NOTE — ED Notes (Signed)
Pelvic cart at bedside. 

## 2015-06-05 LAB — URINE CULTURE

## 2015-06-07 ENCOUNTER — Telehealth: Payer: Self-pay

## 2015-06-07 NOTE — Telephone Encounter (Signed)
Post ED Visit - Positive Culture Follow-up: Unsuccessful Patient Follow-up  Culture assessed and recommendations reviewed by: []  Enzo BiNathan Batchelder, Pharm.D. []  Celedonio MiyamotoJeremy Frens, Pharm.D., BCPS []  Garvin FilaMike Maccia, Pharm.D. []  Georgina PillionElizabeth Martin, Pharm.D., BCPS []  ChatomMinh Pham, [address]Pharm.D., BCPS, AAHIVP []  Estella HuskMichelle Turner, Pharm.D., BCPS, AAHIVP []  Tennis Mustassie Stewart, Pharm.D. []  Rob Oswaldo DoneVincent, [address]Pharm[address]D.  Positive vaginal culture  [x]  Patient discharged without antimicrobial prescription and treatment is now indicated []  Organism is resistant to prescribed ED discharge antimicrobial []  Patient with positive blood cultures   Unable to contact patient after 3 attempts, letter will be sent to address on file  Brooke Silva, Brooke Silva 06/07/2015, 9:45 AM

## 2015-06-15 ENCOUNTER — Ambulatory Visit (INDEPENDENT_AMBULATORY_CARE_PROVIDER_SITE_OTHER): Admitting: Family Medicine

## 2015-06-15 ENCOUNTER — Encounter: Payer: Self-pay | Admitting: Family Medicine

## 2015-06-15 VITALS — BP 122/86 | HR 76 | Ht 66.0 in | Wt 183.0 lb

## 2015-06-15 DIAGNOSIS — S4991XD Unspecified injury of right shoulder and upper arm, subsequent encounter: Secondary | ICD-10-CM | POA: Diagnosis not present

## 2015-06-15 DIAGNOSIS — S6991XD Unspecified injury of right wrist, hand and finger(s), subsequent encounter: Secondary | ICD-10-CM | POA: Diagnosis not present

## 2015-06-15 DIAGNOSIS — M545 Low back pain, unspecified: Secondary | ICD-10-CM

## 2015-06-15 MED ORDER — METHOCARBAMOL 500 MG PO TABS: 500.0000 mg | ORAL_TABLET | Freq: Three times a day (TID) | ORAL | Status: DC | PRN

## 2015-06-15 MED ORDER — MELOXICAM 15 MG PO TABS: 15.0000 mg | ORAL_TABLET | Freq: Every day | ORAL | Status: DC

## 2015-06-15 NOTE — Patient Instructions (Signed)
We will go ahead with an MRI of your wrist. You still need physical therapy for lumbar, thoracic strains and shoulder impingement. Wear wrist brace as often as possible until I see you back - wear when you sleep also if possible.[place]. Ok to take this off to ice the area 15 minutes at a time 3-4 times a day and to wash it. Meloxicam 15mg  daily with food. Robaxin as needed for muscle spasms. Follow up with me in 6 weeks. Continue current restrictions at work.

## 2015-06-16 NOTE — Assessment & Plan Note (Signed)
FOOSH mechanism with persistent dorsal pain.  Not improving with bracing, meloxicam.  Will go ahead with MRI to assess for occult fracture or scapholunate dissociation.  Continue current restrictions.

## 2015-06-16 NOTE — Progress Notes (Signed)
PCP: No PCP Per Patient  Subjective:   HPI: Patient is a 30 y.o. female here for multiple injuries.  3/31: Patient reports her initial injury to right shoulder, low back, and wrist occurred on 09/12/2014 when at work she tripped over some equipment, sustained FOOSH injury to right wrist. She had also twisted her mid to low back and landed onto right shoulder. Saw physician for these - told she had wrist and shoulder sprains - told to do physical therapy but out of pocket cost was too great. She has continued to have pain in right shoulder at 7/10 level, right wrist 7/10 - sharp, low back 5/10. Some tingling in right hand all digits. Hard to grasp items with this hand. Does not have records with her from physician she saw in [place] - unable to pull these up in Care Everywhere. No bowel/bladder dysfunction. No skin changes, fever, other complaints  5/1: Patient returns without much improvement since last visit. Has not heard about physical therapy yet. Pain in right wrist is 6/10 level, sharp.  Gets some numbness as well. Using wrist brace. No skin changes. Low back is uncomfortable but 0/10 level now. Right shoulder posteriorly and lateral is 7/10 level of pain. Taking mobic and robaxin.  No past medical history on file.  Current Outpatient Prescriptions on File Prior to Visit  Medication Sig Dispense Refill  . metroNIDAZOLE (METROGEL VAGINAL) 0.75 % vaginal gel Place 1 application in the vagina at bedtime for 5 days. 70 g 0  . [DISCONTINUED] omeprazole (PRILOSEC) 20 MG capsule Take 1 capsule (20 mg total) by mouth daily. 30 capsule 0   No current facility-administered medications on file prior to visit.    No past surgical history on file.  No Known Allergies  Social History   Social History  . Marital Status: Single    Spouse Name: N/A  . Number of Children: N/A  . Years of Education: N/A   Occupational History  . Not on file.   Social History Main Topics   . Smoking status: Never Smoker   . Smokeless tobacco: Not on file  . Alcohol Use: No  . Drug Use: No  . Sexual Activity: Yes    Birth Control/ Protection: None   Other Topics Concern  . Not on file   Social History Narrative    No family history on file.  BP 122/86 mmHg  Pulse 76  Ht  (1.676 m)  Wt 183 lb (83.008 kg)  BMI 29.55 kg/m2  LMP 04/23/2015  Review of Systems: See HPI above.    Objective:  Physical Exam:  Gen: NAD, comfortable in exam room  Right shoulder: No swelling, ecchymoses.  No gross deformity. TTP right thoracic paraspinal region FROM with painful arc. Positive Hawkins, Neers. Negative Speeds, Yergasons. Strength 5/5 with empty can and resisted internal/external rotation. Negative apprehension. NV intact distally.  Right wrist: No gross deformity, swelling, bruising. TTP dorsal wrist joint - no snuffbox or other bony tenderness. FROM with pain on dorsiflexion. Negative tinels carpal tunnel. NVI distally currently.  Back - not reexamined today. No gross deformity, scoliosis. TTP R > L paraspinal low thoracic and lumbar regions.  No midline or bony TTP. FROM. Strength LEs 5/5 all muscle groups.   2+ MSRs in patellar and achilles tendons, equal bilaterally. Negative SLRs. Sensation intact to light touch bilaterally. Negative logroll bilateral hips Negative fabers and piriformis stretches.    Assessment & Plan:  1. Right shoulder injury - consistent with  thoracic strain though now has elements of impingement.  Shoulder exam otherwise benign, reassured.  Meloxicam with robaxin as needed.  Physical therapy and home exercises.  2. Right wrist injury - FOOSH mechanism with persistent dorsal pain.  Not improving with bracing, meloxicam.  Will go ahead with MRI to assess for occult fracture or scapholunate dissociation.  Continue current restrictions.  3.  Low back pain - 2/2 thoracic and lumbar strains.  Start physical therapy, meloxicam  with robaxin as needed.

## 2015-06-16 NOTE — Assessment & Plan Note (Signed)
consistent with thoracic strain though now has elements of impingement.  Shoulder exam otherwise benign, reassured.  Meloxicam with robaxin as needed.  Physical therapy and home exercises.

## 2015-06-16 NOTE — Assessment & Plan Note (Signed)
2/2 thoracic and lumbar strains.  Start physical therapy, meloxicam with robaxin as needed.  

## 2015-06-17 NOTE — Addendum Note (Signed)
Addended by: Kathi SimpersWISE, Haniyyah Sakuma F on: 06/17/2015 08:20 AM   Modules accepted: Orders

## 2015-06-22 ENCOUNTER — Encounter (HOSPITAL_BASED_OUTPATIENT_CLINIC_OR_DEPARTMENT_OTHER): Payer: Self-pay | Admitting: *Deleted

## 2015-06-22 ENCOUNTER — Emergency Department (HOSPITAL_BASED_OUTPATIENT_CLINIC_OR_DEPARTMENT_OTHER)
Admission: EM | Admit: 2015-06-22 | Discharge: 2015-06-22 | Disposition: A | Discharge status: Home / Self Care | Payer: Medicaid - Out of State | Attending: Emergency Medicine | Admitting: Emergency Medicine

## 2015-06-22 DIAGNOSIS — A749 Chlamydial infection, unspecified: Secondary | ICD-10-CM | POA: Diagnosis not present

## 2015-06-22 DIAGNOSIS — N730 Acute parametritis and pelvic cellulitis: Secondary | ICD-10-CM

## 2015-06-22 DIAGNOSIS — Z113 Encounter for screening for infections with a predominantly sexual mode of transmission: Secondary | ICD-10-CM | POA: Diagnosis present

## 2015-06-22 MED ORDER — FLUCONAZOLE 100 MG PO TABS
200.0000 mg | ORAL_TABLET | Freq: Once | ORAL | Status: AC
  Administered 2015-06-22: 200 mg via ORAL
  Filled 2015-06-22: qty 2

## 2015-06-22 MED ORDER — DOXYCYCLINE HYCLATE 100 MG PO CAPS: 100.0000 mg | ORAL_CAPSULE | Freq: Two times a day (BID) | ORAL | Status: DC

## 2015-06-22 MED ORDER — AZITHROMYCIN 250 MG PO TABS
1000.0000 mg | ORAL_TABLET | Freq: Once | ORAL | Status: AC
  Administered 2015-06-22: 1000 mg via ORAL
  Filled 2015-06-22: qty 4

## 2015-06-22 MED ORDER — FLUCONAZOLE 200 MG PO TABS: ORAL_TABLET | ORAL | Status: DC

## 2015-06-22 NOTE — ED Provider Notes (Signed)
CSN: [id number]     Arrival date & time 06/22/15  1324 History   First MD Initiated Contact with Patient 06/22/15 1050     Chief Complaint  Patient presents with  . SEXUALLY TRANSMITTED DISEASE     (Consider location/radiation/quality/duration/timing/severity/associated sxs/prior Treatment) HPI Patient presents to the emergency department with a positive Chlamydia culture and that was called to her on 422 and she states she was unable to get into the health department so she returns here to the emergency department for treatment of chlamydia.  The patient states that she has some lower pelvic type pain and continued vaginal discharge.  The patient states she did not take any medications prior to arrival.  She states nothing sees to make the condition better or worse.  She denies any vaginal bleeding, nausea, vomiting, fever, lethargy, or syncope.  The patient states that she has not had any sexual contacts since prior to her previous ER visit History reviewed. No pertinent past medical history. History reviewed. No pertinent past surgical history. No family history on file. Social History  Substance Use Topics  . Smoking status: Never Smoker   . Smokeless tobacco: None  . Alcohol Use: No   OB History    No data available     Review of Systems All other systems negative except as documented in the HPI. All pertinent positives and negatives as reviewed in the HPI.=   Allergies  Review of patient's allergies indicates no known allergies.  Home Medications   Prior to Admission medications   Medication Sig Start Date End Date Taking? Authorizing Provider  meloxicam (MOBIC) 15 MG tablet Take 1 tablet (15 mg total) by mouth daily. 06/15/15   Lenda Kelp, MD  methocarbamol (ROBAXIN) 500 MG tablet Take 1 tablet (500 mg total) by mouth every 8 (eight) hours as needed. 06/15/15   Lenda Kelp, MD  metroNIDAZOLE (METROGEL VAGINAL) 0.75 % vaginal gel Place 1 application in the vagina at  bedtime for 5 days. 06/04/15   John Molpus, MD   BP 116/78 mmHg  Pulse 66  Temp(Src) 98.5 F (36.9 C) (Oral)  Resp 18  Ht  (1.676 m)  Wt 84.369 kg  BMI 30.04 kg/m2  SpO2 100%  LMP 05/30/2015 Physical Exam  Constitutional: She is oriented to person, place, and time. She appears well-developed and well-nourished.  HENT:  Head: Normocephalic and atraumatic.  Pulmonary/Chest: Effort normal.  Abdominal: Soft. Bowel sounds are normal. She exhibits no distension. There is tenderness. There is no rebound and no guarding.  Patient has pain over the lower pelvic region.  I do not feel she needs another pelvic examination at this point since she had a positive chlamydia.  That has been untreated  Neurological: She is alert and oriented to person, place, and time.  Skin: Skin is warm and dry. No rash noted. No erythema.  Nursing note and vitals reviewed.   ED Course  Procedures (including critical care time) Labs Review Labs Reviewed - No data to display  Imaging Review No results found. I have personally reviewed and evaluated these images and lab results as part of my medical decision-making.   Patient be treated with Zithromax.  Also will give treatment for PID due to the fact she has lower pelvic pain on examination and she has a positive chlamydia.  The Zithromax was given due to the fact that we just want to ensure that the chlamydia is treated.  Patient is given the plan and all  questions were answered.  Patient voices an understanding of the plan and is able to moist and understanding of the course of treatment  Charlestine NightChristopher Caedyn Raygoza, PA-C 06/22/15 1059  Eber HongBrian Miller, MD 06/23/15 1056

## 2015-06-22 NOTE — ED Notes (Signed)
Reports she was seen here in April and was notified of + test for chlamydia. Attempted tp f/u with health dept but was told they could see her for 30 days. States she is still having vaginal discharge. Denies any sexual activity since her visit here on 4/20

## 2015-06-22 NOTE — Discharge Instructions (Signed)
Return here as needed.  Follow-up with your primary care doctor °

## 2015-06-22 NOTE — ED Notes (Signed)
Pt directed to pharmacy to pick up medications 

## 2015-06-27 ENCOUNTER — Emergency Department (HOSPITAL_BASED_OUTPATIENT_CLINIC_OR_DEPARTMENT_OTHER)
Admission: EM | Admit: 2015-06-27 | Discharge: 2015-06-28 | Disposition: A | Discharge status: Home / Self Care | Attending: Emergency Medicine | Admitting: Emergency Medicine

## 2015-06-27 DIAGNOSIS — M25511 Pain in right shoulder: Secondary | ICD-10-CM | POA: Insufficient documentation

## 2015-06-28 ENCOUNTER — Emergency Department (HOSPITAL_BASED_OUTPATIENT_CLINIC_OR_DEPARTMENT_OTHER)

## 2015-06-28 ENCOUNTER — Encounter (HOSPITAL_BASED_OUTPATIENT_CLINIC_OR_DEPARTMENT_OTHER): Payer: Self-pay | Admitting: *Deleted

## 2015-06-28 MED ORDER — HYDROCODONE-ACETAMINOPHEN 5-325 MG PO TABS: 1.0000 | ORAL_TABLET | Freq: Four times a day (QID) | ORAL | Status: DC | PRN

## 2015-06-28 NOTE — ED Provider Notes (Signed)
CSN: [id number]     Arrival date & time 06/27/15  2354 History   First MD Initiated Contact with Patient 06/28/15 0014     Chief Complaint  Patient presents with  . Shoulder Pain    (Consider location/radiation/quality/duration/timing/severity/associated sxs/prior Treatment) Patient is a 30 y.o. female presenting with shoulder pain. The history is provided by the patient and medical records. No language interpreter was used.  Shoulder Pain  Brooke Silva is a 30 y.o. female  who presents to the Emergency Department complaining of right shoulder pain which is described as a "pulling feeling" and radiates with shooting pain down to wrist. Patient states initial injury was in July. Today's pain is no different and pain she has been experiencing throughout the last few months. She is being followed by sports medicine, Dr.Hundall. Prescribed Robaxin and Mobic. States she is not taking them regularly like she is supposed to be. He has been told she needs physical therapy and is awaiting some form of insurance approval before she can begin to do this. Denies numbness or tingling. Denies chest pain, shortness of breath. Denies new injury.  History reviewed. No pertinent past medical history. History reviewed. No pertinent past surgical history. No family history on file. Social History  Substance Use Topics  . Smoking status: Never Smoker   . Smokeless tobacco: None  . Alcohol Use: No   OB History    No data available     Review of Systems  Musculoskeletal: Positive for arthralgias.  Skin: Negative for color change.      Allergies  Review of patient's allergies indicates no known allergies.  Home Medications   Prior to Admission medications   Medication Sig Start Date End Date Taking? Authorizing Provider  doxycycline (VIBRAMYCIN) 100 MG capsule Take 1 capsule (100 mg total) by mouth 2 (two) times daily. 06/22/15   Charlestine Nighthristopher Lawyer, PA-C  fluconazole (DIFLUCAN) 200 MG tablet If  he started to have any symptoms of yeast infection 06/22/15   Charlestine Nighthristopher Lawyer, PA-C  HYDROcodone-acetaminophen (NORCO/VICODIN) 5-325 MG tablet Take 1 tablet by mouth every 6 (six) hours as needed for severe pain. 06/28/15   Avi Kerschner Pilcher Ernst Cumpston, PA-C  meloxicam (MOBIC) 15 MG tablet Take 1 tablet (15 mg total) by mouth daily. 06/15/15   Lenda KelpShane R Hudnall, MD  methocarbamol (ROBAXIN) 500 MG tablet Take 1 tablet (500 mg total) by mouth every 8 (eight) hours as needed. 06/15/15   Lenda KelpShane R Hudnall, MD  metroNIDAZOLE (METROGEL VAGINAL) 0.75 % vaginal gel Place 1 application in the vagina at bedtime for 5 days. 06/04/15   John Molpus, MD   BP 129/101 mmHg  Pulse 85  Temp(Src) 98.1 F (36.7 C) (Oral)  Resp 16  Ht 5\' 6"  (1.676 m)  Wt 84.369 kg  BMI 30.04 kg/m2  SpO2 100%  LMP 05/30/2015 (Exact Date) Physical Exam  Constitutional: She is oriented to person, place, and time. She appears well-developed and well-nourished.  Alert and in no acute distress  HENT:  Head: Normocephalic and atraumatic.  Neck:  No midline or paraspinal tenderness. Full range of motion without pain.  Cardiovascular: Normal rate, regular rhythm, normal heart sounds and intact distal pulses.  Exam reveals no gallop and no friction rub.   No murmur heard. Pulmonary/Chest: Effort normal and breath sounds normal. No respiratory distress. She has no wheezes. She has no rales. She exhibits no tenderness.  Abdominal: Soft. Bowel sounds are normal. She exhibits no distension and no mass. There is no tenderness. There is  no rebound and no guarding.  Musculoskeletal:       Arms: Right shoulder: Decreased range of motion 2/2 pain - able to flex to approximately 120. Tenderness to palpation as depicted in image. No erythema, swelling, crepitus, or deformity noted. No step-off. 2+ radial pulses and sensation intact.  Neurological: She is alert and oriented to person, place, and time.  Skin: Skin is warm and dry.  Nursing note and vitals  reviewed.   ED Course  Procedures (including critical care time) Labs Review Labs Reviewed - No data to display  Imaging Review No results found. I have personally reviewed and evaluated these images and lab results as part of my medical decision-making.   EKG Interpretation None      MDM   Final diagnoses:  Right shoulder pain   Brooke Silva presents to ED for chronic shoulder pain that has been ongoing since July 2016. She has been followed by Dr. Jamey Ripa at sports medicine and has an appointment scheduled for Monday. Discussed risks and benefits of imaging-she states she has had several x-rays of her right shoulder all with no abnormalities. Given that there are no new injuries and these are all chronic symptoms, I do not believe imaging is warranted. Patient agrees with this. Encouraged her to take Robaxin and Mobic as her sports medicine physician has prescribed it as she has been noncompliant with this treatment plan. Will give 3 hydrocodone pills to help with her pain until she sees specialist on Monday.     [facility] Heran Campau, PA-C 06/28/15 [id number]  Shon Baton, MD 06/28/15 [phone]

## 2015-06-28 NOTE — ED Notes (Signed)
Pt of Dr. Pearletha ForgeHudnall, being treated/ followed for shoulder sprain. C/o worsening shoulder pain. Radiates from R shoulder to wrist, shoulder > wrist, also describes as heavy, stiff, a pulling with some numbness. CMS intact, ROM limited.

## 2015-06-28 NOTE — Discharge Instructions (Signed)
Take pain medication only as needed for severe pain - This can make you very drowsy - please do not drink or drive on this medication Keep your scheduled appointment with the sports medicine physician.

## 2015-07-02 ENCOUNTER — Telehealth: Payer: Self-pay | Admitting: Family Medicine

## 2015-07-02 NOTE — Telephone Encounter (Signed)
She is to come back 6 weeks after her last appointment

## 2015-07-02 NOTE — Telephone Encounter (Signed)
Spoke to patient and got information to address on form.

## 2015-07-15 ENCOUNTER — Telehealth: Payer: Self-pay | Admitting: Family Medicine

## 2015-07-15 NOTE — Telephone Encounter (Signed)
That's correct and how I filled them out.  There is no medical reason for her to miss more days than that.

## 2015-07-15 NOTE — Telephone Encounter (Signed)
I told the patient you had papers filled out correctly.  She was quite upset because every time she is out she feels in danger of losing her job unless she goes to the ED or has doctor's permission.  Being out only once every two months not only interferes with her being able to be out of work when she is having more pain but also it interferes with how often she can come for f/u appointments and/or PT.  She had wanted you to leave that part blank like last time or give more days out of work, more often.  So, FYI and I told her to discuss it all with you at her next f/u.  Maybe you can call to explain better?

## 2015-07-16 NOTE — Telephone Encounter (Signed)
We will discuss at her office visit.  Again, no medical reason for her to have more off days than that.  She is on light duty.

## 2015-07-28 ENCOUNTER — Ambulatory Visit: Admitting: Family Medicine

## 2015-07-29 ENCOUNTER — Telehealth: Payer: Self-pay | Admitting: Family Medicine

## 2015-07-29 NOTE — Telephone Encounter (Signed)
Spoke to patient and rescheduled appointment. Will talk about information needed at appointment.

## 2015-07-30 ENCOUNTER — Ambulatory Visit: Admitting: Family Medicine

## 2015-07-31 ENCOUNTER — Encounter: Payer: Self-pay | Admitting: Family Medicine

## 2015-07-31 ENCOUNTER — Ambulatory Visit (INDEPENDENT_AMBULATORY_CARE_PROVIDER_SITE_OTHER): Admitting: Family Medicine

## 2015-07-31 VITALS — BP 119/85 | HR 79 | Ht 66.0 in | Wt 186.0 lb

## 2015-07-31 DIAGNOSIS — M545 Low back pain, unspecified: Secondary | ICD-10-CM

## 2015-07-31 DIAGNOSIS — S4991XD Unspecified injury of right shoulder and upper arm, subsequent encounter: Secondary | ICD-10-CM | POA: Diagnosis not present

## 2015-07-31 DIAGNOSIS — S6991XD Unspecified injury of right wrist, hand and finger(s), subsequent encounter: Secondary | ICD-10-CM

## 2015-07-31 NOTE — Patient Instructions (Signed)
We will go ahead with an MRI of your wrist. You still need physical therapy for lumbar, thoracic strains and shoulder impingement. Wear wrist brace as often as possible. Ok to take this off to ice the area 15 minutes at a time 3-4 times a day and to wash it. Meloxicam 15mg  daily with food. Robaxin as needed for muscle spasms. Follow up with me in 6 weeks. Continue current restrictions at work.

## 2015-08-07 ENCOUNTER — Telehealth: Payer: Self-pay | Admitting: Family Medicine

## 2015-08-07 NOTE — Telephone Encounter (Signed)
We are working on her case and reconciling her current complaints and injuries versus what the [place] [employer] of [employer] says they would need to support her case.  While we requested records from the physician she saw in [place] we never received any.  Gunnar Fusiaula, can you try contacting that physician's office again?  I will need these to make a determination.  As an aside there is no urgency to her receiving this letter about her condition according to this paperwork - she has up to a year to appeal with additional information from the date of the decision by workers comp (which would be July 20, 2015).

## 2015-08-07 NOTE — Telephone Encounter (Signed)
Spoke to patient and she needed to call back, told her to call back on Monday.

## 2015-08-07 NOTE — Telephone Encounter (Signed)
She left a voicemail saying she needs a letter saying if her condition is worsening and it is related to work, ls

## 2015-08-10 NOTE — Assessment & Plan Note (Signed)
2/2 thoracic and lumbar strains.  Start physical therapy, meloxicam with robaxin as needed.  

## 2015-08-10 NOTE — Progress Notes (Signed)
PCP: No PCP Per Patient  Subjective:   HPI: Patient is a 30 y.o. female here for multiple injuries.  3/31: Patient reports her initial injury to right shoulder, low back, and wrist occurred on 09/12/2014 when at work she tripped over some equipment, sustained FOOSH injury to right wrist. She had also twisted her mid to low back and landed onto right shoulder. Saw physician for these - told she had wrist and shoulder sprains - told to do physical therapy but out of pocket cost was too great. She has continued to have pain in right shoulder at 7/10 level, right wrist 7/10 - sharp, low back 5/10. Some tingling in right hand all digits. Hard to grasp items with this hand. Does not have records with her from physician she saw in [place] - unable to pull these up in Care Everywhere. No bowel/bladder dysfunction. No skin changes, fever, other complaints  5/1: Patient returns without much improvement since last visit. Has not heard about physical therapy yet. Pain in right wrist is 6/10 level, sharp.  Gets some numbness as well. Using wrist brace. No skin changes. Low back is uncomfortable but 0/10 level now. Right shoulder posteriorly and lateral is 7/10 level of pain. Taking mobic and robaxin.  6/16: No real changes compared to last visit. Pain is 8/10 in right shoulder, upper back, 7/10 in right wrist.  Sharp, pressure, achy pains. Still with pain low back also. Is on some restrictions at work. Has not heard about physical therapy or MRI for wrist. Taking mobic, robaxin.  No past medical history on file.  Current Outpatient Prescriptions on File Prior to Visit  Medication Sig Dispense Refill  . doxycycline (VIBRAMYCIN) 100 MG capsule Take 1 capsule (100 mg total) by mouth 2 (two) times daily. 28 capsule 0  . fluconazole (DIFLUCAN) 200 MG tablet If he started to have any symptoms of yeast infection 1 tablet 0  . HYDROcodone-acetaminophen (NORCO/VICODIN) 5-325 MG tablet Take  1 tablet by mouth every 6 (six) hours as needed for severe pain. 3 tablet 0  . meloxicam (MOBIC) 15 MG tablet Take 1 tablet (15 mg total) by mouth daily. 30 tablet 2  . methocarbamol (ROBAXIN) 500 MG tablet Take 1 tablet (500 mg total) by mouth every 8 (eight) hours as needed. 60 tablet 1  . metroNIDAZOLE (METROGEL VAGINAL) 0.75 % vaginal gel Place 1 application in the vagina at bedtime for 5 days. 70 g 0  . [DISCONTINUED] omeprazole (PRILOSEC) 20 MG capsule Take 1 capsule (20 mg total) by mouth daily. 30 capsule 0   No current facility-administered medications on file prior to visit.    No past surgical history on file.  No Known Allergies  Social History   Social History  . Marital Status: Single    Spouse Name: N/A  . Number of Children: N/A  . Years of Education: N/A   Occupational History  . Not on file.   Social History Main Topics  . Smoking status: Never Smoker   . Smokeless tobacco: Not on file  . Alcohol Use: No  . Drug Use: No  . Sexual Activity: Yes    Birth Control/ Protection: None   Other Topics Concern  . Not on file   Social History Narrative    No family history on file.  BP 119/85 mmHg  Pulse 79  Ht 5\' 6"  (1.676 m)  Wt 186 lb (84.369 kg)  BMI 30.04 kg/m2  Review of Systems: See HPI above.  Objective:  Physical Exam:  Gen: NAD, comfortable in exam room  Right shoulder: No swelling, ecchymoses.  No gross deformity. TTP right thoracic paraspinal region FROM with painful arc. Positive Hawkins, Neers. Negative Speeds, Yergasons. Strength 5/5 with empty can and resisted internal/external rotation. Negative apprehension. NV intact distally.  Right wrist: No gross deformity, swelling, bruising. TTP dorsal wrist joint - no snuffbox or other bony tenderness. FROM with pain on dorsiflexion. Negative tinels carpal tunnel. NVI distally currently.  Back - not reexamined today. No gross deformity, scoliosis. TTP R > L paraspinal low  thoracic and lumbar regions.  No midline or bony TTP. FROM. Strength LEs 5/5 all muscle groups.   2+ MSRs in patellar and achilles tendons, equal bilaterally. Negative SLRs. Sensation intact to light touch bilaterally. Negative logroll bilateral hips Negative fabers and piriformis stretches.    Assessment & Plan:  1. Right shoulder injury - consistent with thoracic strain though now has elements of impingement.  Shoulder exam otherwise benign, reassured.  Continue meloxicam with robaxin as needed.  Written for physical therapy - has not been approved and indication workers comp claim has been denied though patient plans to appeal.  We have requested her old records from last year following the injury twice but have so far been unable to get these.  Patient reports she needs a letter stating current issues are from that injury a year ago though she had been released to full duty and no indication she had a new injury.  Advised her I need the old records to make a determination.  2. Right wrist injury - FOOSH mechanism with persistent dorsal pain.  Not improving with bracing, meloxicam.  MRI was ordered to assess for occult fracture or scapholunate dissociation.  Continue current restrictions.  3.  Low back pain - 2/2 thoracic and lumbar strains.  Start physical therapy, meloxicam with robaxin as needed.

## 2015-08-10 NOTE — Assessment & Plan Note (Signed)
consistent with thoracic strain though now has elements of impingement.  Shoulder exam otherwise benign, reassured.  Continue meloxicam with robaxin as needed.  Written for physical therapy - has not been approved and indication workers comp claim has been denied though patient plans to appeal.  We have requested her old records from last year following the injury twice but have so far been unable to get these.  Patient reports she needs a letter stating current issues are from that injury a year ago though she had been released to full duty and no indication she had a new injury.  Advised her I need the old records to make a determination.

## 2015-08-10 NOTE — Assessment & Plan Note (Signed)
FOOSH mechanism with persistent dorsal pain.  Not improving with bracing, meloxicam.  MRI was ordered to assess for occult fracture or scapholunate dissociation.  Continue current restrictions.

## 2015-08-14 ENCOUNTER — Telehealth: Payer: Self-pay | Admitting: Family Medicine

## 2015-08-14 NOTE — Telephone Encounter (Signed)
She left a voicemail that she was returning your call.  She has not seen (or has been seeing ?,- I could not hear) another provider because the last provider stated that she needs PT.  She needs another CA-17 filled out and will bring on Monday/ls

## 2015-08-14 NOTE — Telephone Encounter (Signed)
I received paperwork and reviewed this - only one visit from November 12 2014 where she was placed on full duty, diagnosed with resolving sprain of right shoulder and wrist, told to start home exercises and just follow up as needed.  I called patient and left her a message asking if there are additional visits to review because there would be a 6 month interval between this and when she started seeing me.  Awaiting call back.

## 2015-08-17 NOTE — Telephone Encounter (Signed)
We spoke on the phone today about workers comp denial of her claim, that I cannot provide a letter saying this is from the initial injury in August - she was only seen once in September, did not have continuity of care there (patient reports she was advised to do physical therapy but couldn't afford this, didn't follow up because physician wanted to see her back after some physical therapy - this is corroborated by plan from Dr. Virgina OrganQureshi).  However, she was placed on full duty - she would have had to keep regular follow-ups with physician there leading up to seeing me or have notation from him that based on her evaluation she was likely to have recurrences (there is no indication of this).  We are going to try to get her the [employer] Coverage if possible and see if we can get her into physical therapy here and the MRI of her wrist as previously ordered.

## 2015-08-19 NOTE — Telephone Encounter (Signed)
Patient came in today, given information to obtain [employer] coverage and told to call us when this goes through so we can go ahead with MRI of wrist, physical therapy.

## 2015-08-19 NOTE — Telephone Encounter (Signed)
See phone note from 6/23 for more details, closing this phone note.

## 2015-08-26 ENCOUNTER — Telehealth: Payer: Self-pay | Admitting: Family Medicine

## 2015-08-26 NOTE — Telephone Encounter (Signed)
They can use my office notes.  She did not notify me of an injury on 2/28 so it's not documented in my notes.  She just told me she started hurting in same areas she was when she hurt herself in July of last year and was pushing and pulling things.  I already told the patient I was unable to write a letter stating this is an exacerbation of her prior injury (for details see my 6/23 note).  The paperwork she showed me stated this workers comp claim was denied.  I recommended physical therapy for her current issues.  I will complete the paperwork she dropped off this week.

## 2015-08-26 NOTE — Telephone Encounter (Signed)
WC called the patient and they still need a narrative report about her injury on 04/14/15 w/ medical diagnosis, what was she doing when injured (pushing, pulling, lifting to load/unload  OTR, ERMCS, and wire cages onto trucks)  They need doctor opinion as to whether this is a new injury or if she aggravated her previous injury from 2016.  Also, please explain if the only way she can is PT and what needs to be done for her to heal?  Basically, wants to know if her condition has worsened or is this a new injury?   This is needed as well as other information she dropped off yesterday. She needs all of this by date on papers (7/18?).  thanks

## 2015-08-27 ENCOUNTER — Encounter: Payer: Self-pay | Admitting: Family Medicine

## 2015-08-27 NOTE — Telephone Encounter (Signed)
I'm completing a narrative report anyway - that's what the paperwork is requesting.  When completed we will let her know.

## 2015-09-02 ENCOUNTER — Telehealth: Payer: Self-pay | Admitting: Family Medicine

## 2015-09-02 NOTE — Telephone Encounter (Signed)
She is already written for intermittent FMLA for a day off every 2 months if needed.  Her medical condition does not warrant more than this if she's already used that day.

## 2015-09-02 NOTE — Telephone Encounter (Signed)
Spoke to patient and gave her information provided by physician. 

## 2015-09-03 ENCOUNTER — Encounter (HOSPITAL_BASED_OUTPATIENT_CLINIC_OR_DEPARTMENT_OTHER): Payer: Self-pay | Admitting: *Deleted

## 2015-09-03 ENCOUNTER — Emergency Department (HOSPITAL_BASED_OUTPATIENT_CLINIC_OR_DEPARTMENT_OTHER)
Admission: EM | Admit: 2015-09-03 | Discharge: 2015-09-03 | Disposition: A | Discharge status: Home / Self Care | Payer: Medicaid - Out of State | Attending: Emergency Medicine | Admitting: Emergency Medicine

## 2015-09-03 DIAGNOSIS — M25511 Pain in right shoulder: Secondary | ICD-10-CM | POA: Insufficient documentation

## 2015-09-03 DIAGNOSIS — G8929 Other chronic pain: Secondary | ICD-10-CM | POA: Insufficient documentation

## 2015-09-03 NOTE — ED Provider Notes (Signed)
CSN: [id number]     Arrival date & time 09/03/15  [id number] History   First MD Initiated Contact with Patient 09/03/15 [phone]     Chief Complaint  Patient presents with  . Shoulder Pain     (Consider location/radiation/quality/duration/timing/severity/associated sxs/prior Treatment) HPI   Patient is a 30 year old female with no significant past medical history who presents to the ED with worsening chronic right shoulder pain. Patient states last 3 days her chronic shoulder pain has worsened and spread to her shoulder blade. Pain is constant, achy, nonradiating, worse with movement, she is not taking anything for the pain. Patient with a work injury in July 2016. Patient is being seen by Dr. Pearletha ForgeHudnall and he prescribed Mobic and Robaxin which she has not been taking. Patient denies recent trauma, numbness/timing, weakness, fever, chills.  History reviewed. No pertinent past medical history. History reviewed. No pertinent past surgical history. History reviewed. No pertinent family history. Social History  Substance Use Topics  . Smoking status: Never Smoker   . Smokeless tobacco: None  . Alcohol Use: No   OB History    No data available     Review of Systems  Constitutional: Negative for fever and chills.  Respiratory: Negative for shortness of breath.   Gastrointestinal: Negative for nausea, vomiting and abdominal pain.  Neurological: Negative for weakness, numbness and headaches.      Allergies  Review of patient's allergies indicates no known allergies.  Home Medications   Prior to Admission medications   Not on File   BP 112/80 mmHg  Pulse 65  Temp(Src) 98.1 F (36.7 C) (Oral)  Resp 18  Ht 5\' 6"  (1.676 m)  Wt 85.276 kg  BMI 30.36 kg/m2  SpO2 100%  LMP 08/24/2015 Physical Exam  Constitutional: She appears well-developed and well-nourished. No distress.  HENT:  Head: Normocephalic and atraumatic.  Eyes: Conjunctivae are normal.  Cardiovascular:  Pulses:  Radial pulses are 2+ on the right side, and 2+ on the left side.  Pulmonary/Chest: Effort normal. No respiratory distress.  Musculoskeletal:  Examination of right shoulder revealed no deformities, ecchymosis, edema, limited AROM due to pain, full PROM, TTP to right trapezius, right supra and infraspinatus muscles, strength 5/5 of bilateral upper extremities, Sensation intact to light touch of BUE, patient neurovascular intact distally.  Neurological: She is alert. No sensory deficit. Coordination normal.  Skin: Skin is warm and dry. She is not diaphoretic.  Psychiatric: She has a normal mood and affect. Her behavior is normal.  Nursing note and vitals reviewed.   ED Course  Procedures (including critical care time) Labs Review Labs Reviewed - No data to display  Imaging Review No results found. I have personally reviewed and evaluated these images and lab results as part of my medical decision-making.   EKG Interpretation None      MDM   Final diagnoses:  Chronic right shoulder pain    Patient with chronic right shoulder pain. No fever, no numbness, no weakness. Discussed RICE and instructed the patient to take the medications as prescribed by Dr. Pearletha ForgeHudnall. Instructed pt to f/u with Dr. Jamey RipaHundall within 2 days. Discussed strict return precautions. Pt expressed understanding to the discharge instructions.     Jerre SimonJessica L Kaleel Schmieder, PA 09/03/15 [id number]  Marily MemosJason Mesner, MD 09/03/15 [id number]

## 2015-09-03 NOTE — Discharge Instructions (Signed)
Take the mobic as prescribed by Dr. Pearletha Forge, every 12 hours. Be sure to eat with this medication. Take the Robaxin as prescribed by Dr. Pearletha Forge. Do not drive or operate machinery while on this medication as it can cause drowsiness. Do not drink alcohol with this medication.   Return to the emergency department if you experience weakness in your right arm, numbness/tingling, fever, chills.   Cryotherapy Cryotherapy means treatment with cold. Ice or gel packs can be used to reduce both pain and swelling. Ice is the most helpful within the first 24 to 48 hours after an injury or flare-up from overusing a muscle or joint. Sprains, strains, spasms, burning pain, shooting pain, and aches can all be eased with ice. Ice can also be used when recovering from surgery. Ice is effective, has very few side effects, and is safe for most people to use. PRECAUTIONS  Ice is not a safe treatment option for people with:  Raynaud phenomenon. This is a condition affecting small blood vessels in the extremities. Exposure to cold may cause your problems to return.  Cold hypersensitivity. There are many forms of cold hypersensitivity, including:  Cold urticaria. Red, itchy hives appear on the skin when the tissues begin to warm after being iced.  Cold erythema. This is a red, itchy rash caused by exposure to cold.  Cold hemoglobinuria. Red blood cells break down when the tissues begin to warm after being iced. The hemoglobin that carry oxygen are passed into the urine because they cannot combine with blood proteins fast enough.  Numbness or altered sensitivity in the area being iced. If you have any of the following conditions, do not use ice until you have discussed cryotherapy with your caregiver:  Heart conditions, such as arrhythmia, angina, or chronic heart disease.  High blood pressure.  Healing wounds or open skin in the area being iced.  Current infections.  Rheumatoid arthritis.  Poor  circulation.  Diabetes. Ice slows the blood flow in the region it is applied. This is beneficial when trying to stop inflamed tissues from spreading irritating chemicals to surrounding tissues. However, if you expose your skin to cold temperatures for too long or without the proper protection, you can damage your skin or nerves. Watch for signs of skin damage due to cold. HOME CARE INSTRUCTIONS Follow these tips to use ice and cold packs safely.  Place a dry or damp towel between the ice and skin. A damp towel will cool the skin more quickly, so you may need to shorten the time that the ice is used.  For a more rapid response, add gentle compression to the ice.  Ice for no more than 10 to 20 minutes at a time. The bonier the area you are icing, the less time it will take to get the benefits of ice.  Check your skin after 5 minutes to make sure there are no signs of a poor response to cold or skin damage.  Rest 20 minutes or more between uses.  Once your skin is numb, you can end your treatment. You can test numbness by very lightly touching your skin. The touch should be so light that you do not see the skin dimple from the pressure of your fingertip. When using ice, most people will feel these normal sensations in this order: cold, burning, aching, and numbness.  Do not use ice on someone who cannot communicate their responses to pain, such as small children or people with dementia. HOW TO MAKE AN  ICE PACK Ice packs are the most common way to use ice therapy. Other methods include ice massage, ice baths, and cryosprays. Muscle creams that cause a cold, tingly feeling do not offer the same benefits that ice offers and should not be used as a substitute unless recommended by your caregiver. To make an ice pack, do one of the following:  Place crushed ice or a bag of frozen vegetables in a sealable plastic bag. Squeeze out the excess air. Place this bag inside another plastic bag. Slide the bag  into a pillowcase or place a damp towel between your skin and the bag.  Mix 3 parts water with 1 part rubbing alcohol. Freeze the mixture in a sealable plastic bag. When you remove the mixture from the freezer, it will be slushy. Squeeze out the excess air. Place this bag inside another plastic bag. Slide the bag into a pillowcase or place a damp towel between your skin and the bag. SEEK MEDICAL CARE IF:  You develop white spots on your skin. This may give the skin a blotchy (mottled) appearance.  Your skin turns blue or pale.  Your skin becomes waxy or hard.  Your swelling gets worse. MAKE SURE YOU:   Understand these instructions.  Will watch your condition.  Will get help right away if you are not doing well or get worse.   This information is not intended to replace advice given to you by your health care provider. Make sure you discuss any questions you have with your health care provider.   Document Released: 09/27/2010 Document Revised: 02/21/2014 Document Reviewed: 09/27/2010 Elsevier Interactive Patient Education 2016 [employer]  Joint Pain Joint pain, which is also called arthralgia, can be caused by many things. Joint pain often goes away when you follow your health care provider's instructions for relieving pain at home. However, joint pain can also be caused by conditions that require further treatment. Common causes of joint pain include:  Bruising in the area of the joint.  Overuse of the joint.  Wear and tear on the joints that occur with aging (osteoarthritis).  Various other forms of arthritis.  A buildup of a crystal form of uric acid in the joint (gout).  Infections of the joint (septic arthritis) or of the bone (osteomyelitis). Your health care provider may recommend medicine to help with the pain. If your joint pain continues, additional tests may be needed to diagnose your condition. HOME CARE INSTRUCTIONS Watch your condition for any changes.  Follow these instructions as directed to lessen the pain that you are feeling.  Take medicines only as directed by your health care provider.  Rest the affected area for as long as your health care provider says that you should. If directed to do so, raise the painful joint above the level of your heart while you are sitting or lying down.  Do not do things that cause or worsen pain.  If directed, apply ice to the painful area:  Put ice in a plastic bag.  Place a towel between your skin and the bag.  Leave the ice on for 20 minutes, 2-3 times per day.  Wear an elastic bandage, splint, or sling as directed by your health care provider. Loosen the elastic bandage or splint if your fingers or toes become numb and tingle, or if they turn cold and blue.  Begin exercising or stretching the affected area as directed by your health care provider. Ask your health care provider what types of  exercise are safe for you.  Keep all follow-up visits as directed by your health care provider. This is important. SEEK MEDICAL CARE IF:  Your pain increases, and medicine does not help.  Your joint pain does not improve within 3 days.  You have increased bruising or swelling.  You have a fever.  You lose 10 lb (4.5 kg) or more without trying. SEEK IMMEDIATE MEDICAL CARE IF:  You are not able to move the joint.  Your fingers or toes become numb or they turn cold and blue.   This information is not intended to replace advice given to you by your health care provider. Make sure you discuss any questions you have with your health care provider.   Document Released: 01/31/2005 Document Revised: 02/21/2014 Document Reviewed: 11/12/2013 Elsevier Interactive Patient Education [employer]

## 2015-09-03 NOTE — ED Notes (Signed)
Pt given discharge papers and given the opportunity to ask questions.

## 2015-09-03 NOTE — ED Notes (Signed)
Pt reports work related injury to her back in 2016, with intermittent wrist and shoulder pain since then. This flare began 3 days ago, and pain is increasing. Pt is followed by dr. Pearletha Forgehudnall if smoc for her injury.

## 2015-09-11 ENCOUNTER — Encounter: Payer: Self-pay | Admitting: Family Medicine

## 2015-09-11 ENCOUNTER — Ambulatory Visit (INDEPENDENT_AMBULATORY_CARE_PROVIDER_SITE_OTHER): Admitting: Family Medicine

## 2015-09-11 DIAGNOSIS — M545 Low back pain, unspecified: Secondary | ICD-10-CM

## 2015-09-11 DIAGNOSIS — S4991XD Unspecified injury of right shoulder and upper arm, subsequent encounter: Secondary | ICD-10-CM | POA: Diagnosis not present

## 2015-09-11 DIAGNOSIS — S6991XD Unspecified injury of right wrist, hand and finger(s), subsequent encounter: Secondary | ICD-10-CM

## 2015-09-11 NOTE — Patient Instructions (Signed)
You still need physical therapy for lumbar, thoracic strains and shoulder impingement. Your ultrasound is very reassuring though - you don't have any muscle or tendon tears that would warrant surgery. Meloxicam 15mg  daily with food as needed. Robaxin as needed for muscle spasms. Call us when the cone coverage goes through so we can order physical therapy. When you do therapy we will likely need to fill out new FMLA paperwork allowing you visits 1-2 times a week, 2 hours out at a time but will lift your restrictions for pushing and pulling. Follow up after you've done 4-6 weeks of physical therapy.

## 2015-09-16 ENCOUNTER — Ambulatory Visit: Payer: Self-pay

## 2015-09-22 NOTE — Assessment & Plan Note (Signed)
FOOSH mechanism with persistent dorsal pain.  Not improved with bracing, meloxicam.  Waiting on [facility] Coverage to get MRI to assess for occult fracture or scapholunate dissociation.  Plan to remove restrictions and allow for visits to PT.

## 2015-09-22 NOTE — Assessment & Plan Note (Signed)
consistent with thoracic strain and impingement.  Shoulder exam otherwise benign, reassured.  MSK u/s also reassuring.  Continue meloxicam with robaxin as needed.  She has not yet gone through process to get [employer] Coverage as workers comp was denied.  Plan to remove restrictions and allow for visits to PT.

## 2015-09-22 NOTE — Assessment & Plan Note (Signed)
2/2 thoracic and lumbar strains.  Start physical therapy, meloxicam with robaxin as needed.

## 2015-09-22 NOTE — Progress Notes (Addendum)
PCP: No PCP Per Patient  Subjective:   HPI: Patient is a 30 y.o. female here for multiple injuries.  3/31: Patient reports her initial injury to right shoulder, low back, and wrist occurred on 09/12/2014 when at work she tripped over some equipment, sustained FOOSH injury to right wrist. She had also twisted her mid to low back and landed onto right shoulder. Saw physician for these - told she had wrist and shoulder sprains - told to do physical therapy but out of pocket cost was too great. She has continued to have pain in right shoulder at 7/10 level, right wrist 7/10 - sharp, low back 5/10. Some tingling in right hand all digits. Hard to grasp items with this hand. Does not have records with her from physician [place] saw in [place] - unable to pull these up in Care Everywhere. No bowel/bladder dysfunction. No skin changes, fever, other complaints  5/1: Patient returns without much improvement since last visit. Has not heard about physical therapy yet. Pain in right wrist is 6/10 level, sharp.  Gets some numbness as well. Using wrist brace. No skin changes. Low back is uncomfortable but 0/10 level now. Right shoulder posteriorly and lateral is 7/10 level of pain. Taking mobic and robaxin.  6/16: No real changes compared to last visit. Pain is 8/10 in right shoulder, upper back, 7/10 in right wrist.  Sharp, pressure, achy pains. Still with pain low back also. Is on some restrictions at work. Has not heard about physical therapy or MRI for wrist. Taking mobic, robaxin.  7/28: Patient returns stating pain is the same as last visit. Pain level 7/10, sharp right shoulder, upper back, right wrist. Continued on restrictions at work. Reports having taken days off due to severity of pain. Taking mobic and robaxin as needed. Workers comp claim was denied. Was given cone coverage paperwork after last visit and she has not set up appointment to get this yet. No skin changes,  numbness.  No past medical history on file.  Current Outpatient Prescriptions on File Prior to Visit  Medication Sig Dispense Refill  . [DISCONTINUED] omeprazole (PRILOSEC) 20 MG capsule Take 1 capsule (20 mg total) by mouth daily. 30 capsule 0   No current facility-administered medications on file prior to visit.     No past surgical history on file.  No Known Allergies  Social History   Social History  . Marital status: Single    Spouse name: N/A  . Number of children: N/A  . Years of education: N/A   Occupational History  . Not on file.   Social History Main Topics  . Smoking status: Never Smoker  . Smokeless tobacco: Never Used  . Alcohol use No  . Drug use: No  . Sexual activity: Yes    Birth control/ protection: None   Other Topics Concern  . Not on file   Social History Narrative  . No narrative on file    No family history on file.  BP 113/80   Pulse 80   Ht 5\' 7"  (1.702 m)   Wt 188 lb (85.3 kg)   LMP 08/24/2015   BMI 29.44 kg/m   Review of Systems: See HPI above.    Objective:  Physical Exam:  Gen: NAD, comfortable in exam room  Right shoulder: No swelling, ecchymoses.  No gross deformity. TTP right thoracic paraspinal region FROM with painful arc. Positive Hawkins, Neers. Negative Speeds, Yergasons. Strength 5/5 with empty can and resisted internal/external rotation. Negative apprehension.  NV intact distally.  MSK u/s right shoulder:  Biceps tendon appears normal on long and trans views.  AC joint normal without arthropathy.  Subscapularis, infraspinatus, supraspinatus without tears.  No other abnormalities.    Assessment & Plan:  1. Right shoulder injury - consistent with thoracic strain and impingement.  Shoulder exam otherwise benign, reassured.  MSK u/s also reassuring.  Continue meloxicam with robaxin as needed.  She has not yet gone through process to get [employer] Coverage as workers comp was denied.  Plan to remove restrictions and  allow for visits to PT.  2. Right wrist injury - FOOSH mechanism with persistent dorsal pain.  Not improved with bracing, meloxicam.  Waiting on [employer] Coverage to get MRI to assess for occult fracture or scapholunate dissociation.  Plan to remove restrictions and allow for visits to PT.  3.  Low back pain - 2/2 thoracic and lumbar strains.  Start physical therapy, meloxicam with robaxin as needed.  Addendum:  MRI results reviewed and left detailed voicemail for patient.  Lumbar spine MRI with only mild lower facet arthropathy - no evidence of cause for her pain suggesting muscular nature.  Wrist MRI shows no occult fracture or scapholunate dissociation.  Mention of possible [facility] tear but she was not tender at TFCC on exam.  Wrist brace as needed, icing, nsaids.

## 2015-09-23 ENCOUNTER — Ambulatory Visit: Payer: Self-pay

## 2015-09-28 ENCOUNTER — Telehealth: Payer: Self-pay | Admitting: General Practice

## 2015-09-28 NOTE — Telephone Encounter (Signed)
APPT. REMINDER CALL, LMTCB °

## 2015-09-29 ENCOUNTER — Ambulatory Visit: Payer: Self-pay

## 2015-09-29 ENCOUNTER — Encounter: Payer: Self-pay | Admitting: Family Medicine

## 2015-09-29 ENCOUNTER — Telehealth: Payer: Self-pay | Admitting: Family Medicine

## 2015-09-29 NOTE — Telephone Encounter (Signed)
Needs a letter stating that she was

## 2015-10-08 ENCOUNTER — Telehealth: Payer: Self-pay | Admitting: Family Medicine

## 2015-10-08 NOTE — Telephone Encounter (Signed)
Spoke to patient and gave her information that was provided by the physician. The patient would like to talk with you about why she is supposed to return to work as full duty when the paper has a 15 lb weight restriction.

## 2015-10-08 NOTE — Telephone Encounter (Signed)
As we discussed at last visit we are putting her back on full duty now - when she does physical therapy we can fill out paperwork allowing her to do physical therapy visits.  She was told at office visit 7/28 about this.

## 2015-10-09 NOTE — Telephone Encounter (Signed)
Called yesterday and left her a voicemail.

## 2015-10-14 ENCOUNTER — Telehealth: Payer: Self-pay | Admitting: Family Medicine

## 2015-10-14 NOTE — Telephone Encounter (Signed)
Yes, now that she has [employer] Coverage I would like her to do physical therapy as noted in prior office note.  Why couldn't she turn in duty status report?

## 2015-10-14 NOTE — Telephone Encounter (Signed)
We filled out her Duty Status Report on 8/18 indicating full duty and haven't had to give her a letter for work, restrictions or otherwise, since April.  The duty status report should be sufficient.  If she needs a letter we can provide that too with the same date.

## 2015-10-14 NOTE — Telephone Encounter (Signed)
If you want him on full duty, she needs documentation of that saying no restrictions (if that is the case), so that she can give to her work [employer] account executivetomorrow.  Also, she has the orange card now.  thanks

## 2015-10-14 NOTE — Telephone Encounter (Signed)
Spoke to patient and gave her information provided by physician. She stated that she could not turn in the duty status report (8/18). She states that her work needs a letter stating that she is back to full duty with no restrictions.  Patient wants to know if you want her to do PT as well.

## 2015-10-15 NOTE — Telephone Encounter (Signed)
Spoke to patient and will refer for physical therapy.

## 2015-10-20 NOTE — Telephone Encounter (Signed)
Dated the letter for 8/18.

## 2015-11-16 ENCOUNTER — Telehealth: Payer: Self-pay | Admitting: Family Medicine

## 2015-11-16 NOTE — Telephone Encounter (Signed)
Noted  

## 2015-11-30 ENCOUNTER — Telehealth: Payer: Self-pay | Admitting: Family Medicine

## 2015-11-30 NOTE — Telephone Encounter (Signed)
She says, that when she gets a bill they come in a cone envelope but say [employer] [employer] and that is why she thinks it says [employer] on her financial aid letter.   I'm not sure what to tell her but i said it needs to say [employer] on it.  She says she has not applied anywhere else .  (??)  She said she will call [facility] but please call patient to clarify also, thanks

## 2016-01-04 ENCOUNTER — Telehealth: Payer: Self-pay | Admitting: Family Medicine

## 2016-01-05 NOTE — Telephone Encounter (Signed)
Order had been placed and radiology will call and set up appointment.

## 2016-01-06 ENCOUNTER — Telehealth: Payer: Self-pay | Admitting: Family Medicine

## 2016-01-06 NOTE — Telephone Encounter (Signed)
She wanted to see if (now) she could also get an MRI for her shoulder and her back because she recently went to the hospital for her shoulder/back?  She left a voicemail.

## 2016-01-09 ENCOUNTER — Ambulatory Visit (HOSPITAL_BASED_OUTPATIENT_CLINIC_OR_DEPARTMENT_OTHER): Payer: Self-pay

## 2016-01-11 NOTE — Telephone Encounter (Signed)
No need for MRI of her shoulder - ultrasound showed this was normal.  It's ok to order an MRI of her low back/lumbar spine as well though it sounds as though her [facility] Coverage was expired, correct?

## 2016-01-11 NOTE — Telephone Encounter (Signed)
Staff spoke with patient after this phone call - she did not have [employer] Coverage.  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

## 2016-01-13 ENCOUNTER — Telehealth: Payer: Self-pay | Admitting: Family Medicine

## 2016-01-13 NOTE — Addendum Note (Signed)
Addended by: Kathi SimpersWISE, Kitiara Hintze F on: 01/13/2016 10:55 AM   Modules accepted: Orders

## 2016-01-13 NOTE — Telephone Encounter (Signed)
This was addressed in phone note from 11/27.

## 2016-01-13 NOTE — Telephone Encounter (Signed)
Order placed for MRI of lumbar spine

## 2016-01-13 NOTE — Telephone Encounter (Signed)
Spoke to patient and gave her information per physician from phone note dated 11-27.

## 2016-01-16 ENCOUNTER — Ambulatory Visit (HOSPITAL_BASED_OUTPATIENT_CLINIC_OR_DEPARTMENT_OTHER)
Admission: RE | Admit: 2016-01-16 | Discharge: 2016-01-16 | Disposition: A | Discharge status: Home / Self Care | Payer: Self-pay | Source: Ambulatory Visit | Attending: Family Medicine | Admitting: Family Medicine

## 2016-01-16 ENCOUNTER — Emergency Department (HOSPITAL_BASED_OUTPATIENT_CLINIC_OR_DEPARTMENT_OTHER)
Admission: EM | Admit: 2016-01-16 | Discharge: 2016-01-16 | Disposition: A | Discharge status: Home / Self Care | Payer: No Typology Code available for payment source | Attending: Emergency Medicine | Admitting: Emergency Medicine

## 2016-01-16 ENCOUNTER — Encounter (HOSPITAL_BASED_OUTPATIENT_CLINIC_OR_DEPARTMENT_OTHER): Payer: Self-pay | Admitting: *Deleted

## 2016-01-16 DIAGNOSIS — M545 Low back pain, unspecified: Secondary | ICD-10-CM

## 2016-01-16 DIAGNOSIS — G8929 Other chronic pain: Secondary | ICD-10-CM

## 2016-01-16 DIAGNOSIS — S6991XD Unspecified injury of right wrist, hand and finger(s), subsequent encounter: Secondary | ICD-10-CM | POA: Insufficient documentation

## 2016-01-16 DIAGNOSIS — M5410 Radiculopathy, site unspecified: Secondary | ICD-10-CM

## 2016-01-16 DIAGNOSIS — M67431 Ganglion, right wrist: Secondary | ICD-10-CM | POA: Insufficient documentation

## 2016-01-16 DIAGNOSIS — M47816 Spondylosis without myelopathy or radiculopathy, lumbar region: Secondary | ICD-10-CM | POA: Insufficient documentation

## 2016-01-16 DIAGNOSIS — X58XXXD Exposure to other specified factors, subsequent encounter: Secondary | ICD-10-CM | POA: Insufficient documentation

## 2016-01-16 DIAGNOSIS — M25511 Pain in right shoulder: Secondary | ICD-10-CM | POA: Insufficient documentation

## 2016-01-16 NOTE — ED Notes (Signed)
Pt reports she had an MRI done of her back and R wrist just prior to checking in and decided to check in for her results and to have her back pain evaluated. Pt is followed by Dr. Pearletha ForgeHudnall. Pt reports pain is in upper back radiating into her R shoulder.

## 2016-01-16 NOTE — ED Triage Notes (Signed)
States that she is followed by Dr Norton BlizzardShane hudnall for all of the issues that she is having.

## 2016-01-16 NOTE — ED Provider Notes (Signed)
[facility] Provider Note   CSN: [id number] Arrival date & time: 01/16/16  1527   By signing my name below, I, Teofilo PodMatthew P. Jamison, attest that this documentation has been prepared under the direction and in the presence of Tilden FossaElizabeth Gerrett Loman, MD . Electronically Signed: Teofilo PodMatthew P. Jamison, ED Scribe. 01/16/2016. 4:15 PM.   History   Chief Complaint Chief Complaint  Patient presents with  . Shoulder Pain   The history is provided by the patient. No language interpreter was used.   HPI Comments:  Brooke Silva is a 30 y.o. female who presents to the Emergency Department complaining of constant, chronic right shoulder pain x >1 year. Pt notes that the pain is primarily on her right shoulder blade. Pt complains of associated intermittent bilateral fingertip numbness x 1 year, and notes that she has pain radiating to her elbows. Pt also reports left knee pain radiating to her hip, and notes a bruise to her left knee. 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 Pt has taken ibuprofen 800mg  with no relief. Pt denies fever.   History reviewed. No pertinent past medical history.  Patient Active Problem List   Diagnosis Date Noted  . Right shoulder injury 05/18/2015  . Right wrist injury 05/18/2015  . Low back pain 05/18/2015    History reviewed. No pertinent surgical history.  OB History    No data available       Home Medications    Prior to Admission medications   Not on File    Family History History reviewed. No pertinent family history.  Social History Social History  Substance Use Topics  . Smoking status: Never Smoker  . Smokeless tobacco: Never Used  . Alcohol use No     Allergies   Patient has no known allergies.   Review of Systems Review of Systems  Constitutional: Negative for fever.  Musculoskeletal: Positive for arthralgias and myalgias.  Skin: Positive for color change.  Neurological: Positive for  numbness.  All other systems reviewed and are negative.    Physical Exam Updated Vital Signs BP 119/75 (BP Location: Left Arm)   Pulse 70   Temp 97.8 F (36.6 C) (Oral)   Resp 18   Ht 5\' 6"  (1.676 m)   Wt 196 lb (88.9 kg)   LMP 01/16/2016   SpO2 100%   BMI 31.64 kg/m   Physical Exam  Constitutional: She is oriented to person, place, and time. She appears well-developed and well-nourished.  HENT:  Head: Normocephalic and atraumatic.  Cardiovascular: Normal rate and regular rhythm.   No murmur heard. Pulmonary/Chest: Effort normal and breath sounds normal. No respiratory distress.  Abdominal: Soft. There is no tenderness. There is no rebound and no guarding.  Musculoskeletal: Normal range of motion. She exhibits no edema.  2+ radial pulses bilaterally. There is tenderness to palpation over the right upper back and posterior shoulder. There are no bony step-offs or masses. No calf tenderness bilaterally. There is a healing bruise to the posterior left thigh. 2+ DP pulses bilaterally  Neurological: She is alert and oriented to person, place, and time.  5 out of 5 strength in all 4 extremities with sensation to light touch intact in all 4 extremities. AIN/PIN intact in bilateral hands.  Skin: Skin is warm and dry.  Psychiatric: She has a normal mood and affect. Her behavior is normal.  Nursing note and vitals reviewed.    ED Treatments / Results  DIAGNOSTIC  STUDIES:  Oxygen Saturation is 100% on RA, normal by my interpretation.    COORDINATION OF CARE:  4:15 PM Discussed treatment plan with pt at bedside and pt agreed to plan.   Labs (all labs ordered are listed, but only abnormal results are displayed) Labs Reviewed - No data to display  EKG  EKG Interpretation None       Radiology No results found.  Procedures Procedures (including critical care time)  Medications Ordered in ED Medications - No data to display   Initial Impression / Assessment and Plan  / ED Course  I have reviewed the triage vital signs and the nursing notes.  Pertinent labs & imaging results that were available during my care of the patient were reviewed by me and considered in my medical decision making (see chart for details).  Clinical Course     Patient here for evaluation of chronic right posterior shoulder pain is felt well as paresthesias to bilateral hands that has been ongoing for the last year. She recently had MRI performed today but results are not available. She is neurovascularly intact on examination and no evidence of acute infectious process or acute injury. Discussed with patient continuing her current home care with ibuprofen with outpatient follow-up with Dr. Pearletha ForgeHudnall.  Final Clinical Impressions(s) / ED Diagnoses   Final diagnoses:  Chronic right shoulder pain  Radiculopathy, unspecified spinal region    New Prescriptions There are no discharge medications for this patient. I personally performed the services described in this documentation, which was scribed in my presence. The recorded information has been reviewed and is accurate.     Tilden FossaElizabeth Arasely Akkerman, MD 01/16/16 [phone]

## 2016-01-16 NOTE — ED Triage Notes (Signed)
Right shoulder pain x >1 year.  Bilateral wrist pain x >1 year.  Recently reports bilateral knee pain radiating up to hip.

## 2016-01-19 ENCOUNTER — Telehealth: Payer: Self-pay | Admitting: Family Medicine

## 2016-01-19 NOTE — Telephone Encounter (Signed)
Called and left her a voicemail with the results.  Will put addendum in her last note as well.

## 2016-01-22 ENCOUNTER — Telehealth: Payer: Self-pay | Admitting: Family Medicine

## 2016-01-22 NOTE — Telephone Encounter (Signed)
Called patient again - message stating she is not receiving calls at this time.  Unable to leave a voicemail.

## 2016-01-25 NOTE — Telephone Encounter (Signed)
Patient has appointment scheduled for Wednesday - will discuss in detail at that office visit.

## 2016-01-26 ENCOUNTER — Ambulatory Visit: Payer: Medicaid - Out of State | Admitting: Family Medicine

## 2016-01-27 ENCOUNTER — Ambulatory Visit: Payer: Medicaid - Out of State | Admitting: Family Medicine

## 2016-02-02 ENCOUNTER — Ambulatory Visit (INDEPENDENT_AMBULATORY_CARE_PROVIDER_SITE_OTHER): Payer: No Typology Code available for payment source | Admitting: Family Medicine

## 2016-02-02 ENCOUNTER — Encounter: Payer: Self-pay | Admitting: Family Medicine

## 2016-02-02 VITALS — BP 132/87 | HR 79 | Ht 66.0 in | Wt 196.0 lb

## 2016-02-02 DIAGNOSIS — M549 Dorsalgia, unspecified: Secondary | ICD-10-CM

## 2016-02-02 DIAGNOSIS — M25562 Pain in left knee: Secondary | ICD-10-CM

## 2016-02-02 DIAGNOSIS — S4991XD Unspecified injury of right shoulder and upper arm, subsequent encounter: Secondary | ICD-10-CM

## 2016-02-02 DIAGNOSIS — S6991XD Unspecified injury of right wrist, hand and finger(s), subsequent encounter: Secondary | ICD-10-CM

## 2016-02-02 DIAGNOSIS — G8929 Other chronic pain: Secondary | ICD-10-CM

## 2016-02-02 DIAGNOSIS — M545 Low back pain: Secondary | ICD-10-CM

## 2016-02-02 DIAGNOSIS — M25561 Pain in right knee: Secondary | ICD-10-CM

## 2016-02-02 NOTE — Patient Instructions (Signed)
We will go ahead with nerve conduction studies/EMGs of your upper extremities to assess for carpal tunnel syndrome. Wear wrist braces both sides at night and as much as possible at work. Start physical therapy for your upper and lower back. Establish care with a primary care physician (you have done this already) - if everything comes back ok you can request a referral to see someone who specializes in fibromyalgia (some primary care physicians treat this too). Follow up with me in 6 weeks.

## 2016-02-03 DIAGNOSIS — M25561 Pain in right knee: Secondary | ICD-10-CM | POA: Insufficient documentation

## 2016-02-03 DIAGNOSIS — M25562 Pain in left knee: Secondary | ICD-10-CM

## 2016-02-03 NOTE — Assessment & Plan Note (Signed)
MRI normal [facility]).  Now having numbness in bilateral hands, fingers.  Possible carpal tunnel - discussed wrist braces - she would like to do NCVs/EMGs as well - will order these.

## 2016-02-03 NOTE — Assessment & Plan Note (Signed)
exam benign, reassuring.

## 2016-02-03 NOTE — Assessment & Plan Note (Signed)
2/2 thoracic and lumbar strains.  Start physical therapy now.  Lumbar spine MRI with only mild lower facet arthropathy - no abnormalities to explain her symptoms.  Could use ibuprofen or meloxicam if needed.

## 2016-02-03 NOTE — Progress Notes (Addendum)
PCP: No PCP Per Patient  Subjective:   HPI: Patient is a 30 y.o. female here for multiple injuries.  3/31: Patient reports her initial injury to right shoulder, low back, and wrist occurred on 09/12/2014 when at work she tripped over some equipment, sustained FOOSH injury to right wrist. She had also twisted her mid to low back and landed onto right shoulder. Saw physician for these - told she had wrist and shoulder sprains - told to do physical therapy but out of pocket cost was too great. She has continued to have pain in right shoulder at 7/10 level, right wrist 7/10 - sharp, low back 5/10. Some tingling in right hand all digits. Hard to grasp items with this hand. Does not have records with her from physician she saw in [place] - unable to pull these up in Care Everywhere. No bowel/bladder dysfunction. No skin changes, fever, other complaints  5/1: Patient returns without much improvement since last visit. Has not heard about physical therapy yet. Pain in right wrist is 6/10 level, sharp.  Gets some numbness as well. Using wrist brace. No skin changes. Low back is uncomfortable but 0/10 level now. Right shoulder posteriorly and lateral is 7/10 level of pain. Taking mobic and robaxin.  6/16: No real changes compared to last visit. Pain is 8/10 in right shoulder, upper back, 7/10 in right wrist.  Sharp, pressure, achy pains. Still with pain low back also. Is on some restrictions at work. Has not heard about physical therapy or MRI for wrist. Taking mobic, robaxin.  7/28: Patient returns stating pain is the same as last visit. Pain level 7/10, sharp right shoulder, upper back, right wrist. Continued on restrictions at work. Reports having taken days off due to severity of pain. Taking mobic and robaxin as needed. Workers comp claim was denied. Was given cone coverage paperwork after last visit and she has not set up appointment to get this yet. No skin changes,  numbness.  12/19: Patient returns with continued posterior right shoulder, upper back pain to 8/10 level, sharp. Now with new complaints of bilateral anterior knee pain that shoots up to hips without injury. Had a bruise behind her left knee she didn't know how this got there. No swelling, instability. Reports all fingers will go numb in both hands as well. No skin changes aside from bruising behind left knee. Tried mobic, robaxin.  No past medical history on file.  Current Outpatient Prescriptions on File Prior to Visit  Medication Sig Dispense Refill  . [DISCONTINUED] omeprazole (PRILOSEC) 20 MG capsule Take 1 capsule (20 mg total) by mouth daily. 30 capsule 0   No current facility-administered medications on file prior to visit.     No past surgical history on file.  No Known Allergies  Social History   Social History  . Marital status: Single    Spouse name: N/A  . Number of children: N/A  . Years of education: N/A   Occupational History  . Not on file.   Social History Main Topics  . Smoking status: Never Smoker  . Smokeless tobacco: Never Used  . Alcohol use No  . Drug use: No  . Sexual activity: Yes    Birth control/ protection: None   Other Topics Concern  . Not on file   Social History Narrative  . No narrative on file    No family history on file.  BP 132/87   Pulse 79   Ht 5\' 6"  (1.676 m)  Wt 196 lb (88.9 kg)   LMP 01/16/2016   BMI 31.64 kg/m   Review of Systems: See HPI above.    Objective:  Physical Exam:  Gen: NAD, comfortable in exam room  Right shoulder: No swelling, ecchymoses.  No gross deformity. TTP right thoracic paraspinal region FROM. Negative apprehension. NV intact distally.  Bilateral hands/wrists: No gross deformity, swelling, bruising, atrophy. No focal TTP currently. FROM wrists, digits with 5/5 strength all motions. Negative tinels, phalens.   Sensation diminished to light touch in digits right  hand.  Bilateral knees: No gross deformity, ecchymoses, swelling. No TTP. FROM. Negative ant/post drawers. Negative valgus/varus testing. Negative lachmanns. Negative mcmurrays, apleys, patellar apprehension. NV intact distally.    Assessment & Plan:  1. Right shoulder injury - MSK u/s was normal.  At worst due to thoracic strain.  Has cone coverage now to go ahead with PT.    2. Right wrist injury - MRI normal [facility]).  Now having numbness in bilateral hands, fingers.  Possible carpal tunnel - discussed wrist braces - she would like to do NCVs/EMGs as well - will order these.  3.  Low back pain - 2/2 thoracic and lumbar strains.  Start physical therapy now.  Lumbar spine MRI with only mild lower facet arthropathy - no abnormalities to explain her symptoms.  Could use ibuprofen or meloxicam if needed.    4. Bilateral knee pain - exam benign, reassuring.  Of note she has had several pain complaints but normal/reassuring imaging when compared to exam.  We discussed possibility of a condition like fibromyalgia.  Would lend consideration to a somatization disorder as well.  Addendum:  NCVs/EMGs reviewed and normal - no evidence carpal tunnel or other neuropathy of upper extremities.

## 2016-02-03 NOTE — Assessment & Plan Note (Signed)
MSK u/s was normal.  At worst due to thoracic strain.  Has cone coverage now to go ahead with PT.

## 2016-02-19 ENCOUNTER — Telehealth: Payer: Self-pay | Admitting: Family Medicine

## 2016-02-19 NOTE — Telephone Encounter (Signed)
Per notes in the referral they have tried to contact her (and Gunnar Fusiaula has) and the number on file is not an active number.

## 2016-02-22 NOTE — Telephone Encounter (Signed)
Spoke to patient and [facility] and told her to call and set up appointment. Will redo referral.

## 2016-02-22 NOTE — Telephone Encounter (Signed)
I'm not sure which number they used to contact her - Brooke Silva can you look into this with the number Brooke Silva typed in?  And put the referral in again with that phone number if it works?  Thanks!

## 2016-02-23 ENCOUNTER — Telehealth: Payer: Self-pay | Admitting: Family Medicine

## 2016-02-23 NOTE — Telephone Encounter (Signed)
So, I called Brooke Silva.  The papers are for her PT appointment downstairs this Friday (I checked and it is in system).  She needs them to take to that appointment, thanks.

## 2016-02-23 NOTE — Telephone Encounter (Signed)
No they are not.  This morning was the first time I've seen them.  Why does she need them filled out?  For PT visits?

## 2016-02-26 ENCOUNTER — Telehealth: Payer: Self-pay | Admitting: Family Medicine

## 2016-02-26 ENCOUNTER — Ambulatory Visit: Payer: Self-pay | Attending: Family Medicine | Admitting: Physical Therapy

## 2016-02-26 DIAGNOSIS — M546 Pain in thoracic spine: Secondary | ICD-10-CM | POA: Insufficient documentation

## 2016-02-26 DIAGNOSIS — M6281 Muscle weakness (generalized): Secondary | ICD-10-CM | POA: Insufficient documentation

## 2016-02-26 DIAGNOSIS — M25511 Pain in right shoulder: Secondary | ICD-10-CM | POA: Insufficient documentation

## 2016-02-26 DIAGNOSIS — M5441 Lumbago with sciatica, right side: Secondary | ICD-10-CM | POA: Insufficient documentation

## 2016-02-26 DIAGNOSIS — G8929 Other chronic pain: Secondary | ICD-10-CM | POA: Insufficient documentation

## 2016-02-26 NOTE — Patient Instructions (Addendum)

## 2016-02-26 NOTE — Telephone Encounter (Signed)
No restrictions at work.  Physical therapy is not recommended for her wrist.  Just wrist braces and waiting on test results.

## 2016-02-26 NOTE — Therapy (Addendum)
[facility] [address]  [address] [place], [place], [address] Phone: [phone]   Fax:  [phone]  Physical Therapy Evaluation  Patient Details  Name: Brooke Silva MRN: [id number] Date of Birth: 1985-11-25 Referring Provider: Dr. Norton Blizzard   Encounter Date: 02/26/2016      PT End of Session - 02/26/16 1017    Visit Number 1   Number of Visits 12   Date for PT Re-Evaluation 04/08/16   Authorization Type [facility] [employer]   Authorization Time Period termination date: 04-10-16   PT Start Time 0930   PT Stop Time 1017   PT Time Calculation (min) 47 min   Activity Tolerance Patient tolerated treatment well   Behavior During Therapy [facility] for tasks assessed/performed      No past medical history on file.  No past surgical history on file.  There were no vitals filed for this visit.       Subjective Assessment - 02/26/16 0932    Subjective Pt presents with R wrist, shoulder, upper and lower back pain resulting from a fall at work in July 2016 when she tripped over a piece of equipment. Pt reports she has been having shoulder pain and wrist pain from work injury that was a fall. Reports pain fluctuates with some days worse than others, with pain often exacerbated by work activities of pushing and pulling and walking/standing on concrete floors.   Limitations Lifting;Walking   How long can you walk comfortably? 6 hours    Diagnostic tests MRI lumbar spine 01/21/16: Mild lumbar spondylosis.  No compressive lesion or subluxation.   Patient Stated Goals "stop being in pain"   Currently in Pain? Yes   Pain Score 7   Worst: 9/10 Best: 3/10 Avg: 7/10    Pain Location Shoulder   Pain Orientation Right;Posterior   Pain Descriptors / Indicators Aching;Dull   Pain Type Chronic pain   Pain Radiating Towards pain starts around shoulder blade and shoots down arm near elbow area with dull, achy pain.    Pain Onset More than a  month ago   Pain Frequency Constant   Aggravating Factors  lifting, pushing (65-70 lbs), cold increases aching   Pain Relieving Factors Meloxacam   Effect of Pain on Daily Activities still able to do everything but bad days result in lying around   Multiple Pain Sites Yes   Pain Score 3  Worst: 9/10 Best: 4/10 Avg: 5/10   Pain Location Back   Pain Orientation Upper;Lower   Pain Descriptors / Indicators Aching;Dull   Pain Type Chronic pain   Pain Radiating Towards pain shoots down R leg toward ankle with N/T, feels like walking on needles sometimes   Pain Onset More than a month ago   Pain Frequency Constant   Aggravating Factors  walking   Pain Relieving Factors stretch, Meloxocam   Effect of Pain on Daily Activities still do everything but must lay down on bad days            [facility] PT Assessment - 02/26/16 0947      Assessment   Medical Diagnosis Upper Back Pain   Referring Provider Dr. Norton Blizzard    Onset Date/Surgical Date --  July 2016   Hand Dominance Right   Next MD Visit None   Prior Therapy None     Balance Screen   Has the patient fallen in the past 6 months No   Has the patient had a  decrease in activity level because of a fear of falling?  No   Is the patient reluctant to leave their home because of a fear of falling?  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), Pushing, Pulling, grasp   Leisure no regular exercise or workout, plans on startin     Observation/Other Assessments   Focus on Therapeutic Outcomes (FOTO)  Thoracic spine 63% (37% limitation); predicted 72% (38% limitation)      Posture/Postural Control   Posture Comments slight depression of R shoulder     ROM / Strength   AROM / PROM / Strength AROM;Strength     AROM   AROM Assessment Site Shoulder   Right/Left Shoulder Right;Left   Right Shoulder Flexion 137 Degrees   Right Shoulder ABduction 125 Degrees   Right Shoulder Internal Rotation --  FIR   Right Shoulder External Rotation --  FER   Left Shoulder Flexion 146 Degrees   Left Shoulder ABduction 134 Degrees   Left Shoulder Internal Rotation --  FIR   Left Shoulder External Rotation --  FER   Lumbar Flexion WFL   Lumbar Extension WFL   Lumbar - Right Side Bend WFl   Lumbar - Left Side Bend WFL   Lumbar - Right Rotation WFL   Lumbar - Left Rotation WFL     Strength   Right Hip Flexion 4-/5   Right Hip Extension 3/5   Right Hip External Rotation  4-/5   Right Hip Internal Rotation 4-/5   Right Hip ABduction 3+/5   Right Hip ADduction 3+/5   Left Hip Flexion 4-/5   Left Hip Extension 3+/5   Left Hip External Rotation 4-/5   Left Hip Internal Rotation 4-/5   Left Hip ABduction 4-/5   Left Hip ADduction 4-/5   Right Knee Flexion 4-/5   Right Knee Extension 4/5   Left Knee Flexion 4/5   Left Knee Extension 4+/5     Flexibility   Hamstrings moderate tightness; feels slight pull   Quadriceps mild tightness   ITB mild tightness   Piriformis mild to moderate tightness     Palpation   Palpation comment increased muscle tension in UT, LS, rhomboids, paraspinals                   [facility] Adult PT Treatment/Exercise - 02/26/16 0947      Lumbar Exercises: Stretches   Passive Hamstring Stretch 1 rep;30 seconds   Passive Hamstring Stretch Limitations supine with strap   Single Knee to Chest Stretch 1 rep;30 seconds   Single Knee to Chest Stretch Limitations KTOS & figure 4     Shoulder Exercises: Seated   Retraction Both;10 reps   Retraction Limitations 5" hold     Shoulder Exercises: Stretch   Corner Stretch 3 reps;30  seconds   Corner Stretch Limitations low/mid/high doorway stretch                PT Education - 02/26/16 1006    Education provided Yes   Education Details Eval findings, posture/body mechanics & initial HEP  Person(s) Educated Patient   Methods Explanation;Demonstration;Handout   Comprehension Verbalized understanding;Returned demonstration          PT Short Term Goals - 02/26/16 1017      PT SHORT TERM GOAL #1   Title Independent with initial HEP by 03/11/16   Status New           PT Long Term Goals - 02/26/16 1017      PT LONG TERM GOAL #1   Title Independent with advanced HEP as indicated by 04/08/16   Status New     PT LONG TERM GOAL #2   Title B shoulder, hip and knee strength >/= 4/5 for improved postural stability by 04/08/16   Status New     PT LONG TERM GOAL #3   Title Pt able to demonstrate proper posture and bodymechanics for lifting, pushing and pulling to improve safety with job performance by 04/08/16   Status New     PT LONG TERM GOAL #4   Title Pt will report ability to complete her household chores and job tasks with >/= 50% reduction in pain by 04/08/16   Status New               Plan - 02/26/16 1017    Clinical Impression Statement Jeannine BogaKimyatta is 31 y/o R handed female who presents with ~18 month h/o of R upper back, shoulder/scapular and low back pain originating from a fall at work in July 2016. Pt reports worst pain currently in R scapular/shoulder region but states pain in both shoulder and back fluctuates. Pt able to work w/o restrictions but states on bad days she does not do anything at home. Assessment revealed B shoulder ROM mildly limited in flexion and abduction, R>L, with lumbar ROM WFL. Strength testing demonstrates mild to moderate weakness in B proximal LE. Postural assessment revealed forward head, increased thoracic kyphosis, and rounded shoulders bilaterally with decreased lumbar lordosis. Increased muscle tension/tightness  noted in B pectoralis, along with B UT, LS, rhomboids and paraspinals. POC will focus on improving postural awareness including increasing muscle flexibility along with soft tissue pliability, scapular and lumbar strengthening/stabilization, B shoulder and hip strengthening, and manual therapy and modalities PRN for pain. Pt may benefit from TDN and/or kinesiotaping to address muscle tightness and promote muscle relaxation as well as possible trial of TENS for pain control as indicated.   Rehab Potential Good   Clinical Impairments Affecting Rehab Potential chronicity of pain (>18 months)   PT Frequency 2x / week   PT Duration 6 weeks   PT Treatment/Interventions Patient/family education;Neuromuscular re-education;Therapeutic exercise;Manual techniques;Taping;Dry needling;Therapeutic activities;Electrical Stimulation;Moist Heat;Iontophoresis 4mg /ml Dexamethasone;ADLs/Self Care Home Management      Patient will benefit from skilled therapeutic intervention in order to improve the following deficits and impairments:  Pain, Impaired flexibility, Decreased strength, Decreased range of motion, Postural dysfunction, Improper body mechanics, Decreased activity tolerance, Increased muscle spasms  Visit Diagnosis: Chronic right shoulder pain - Plan: PT plan of care cert/re-cert  Pain in thoracic spine - Plan: PT plan of care cert/re-cert  Chronic midline low back pain with right-sided sciatica - Plan: PT plan of care cert/re-cert  Muscle weakness (generalized) - Plan: PT plan of care cert/re-cert     Problem List Patient Active Problem List   Diagnosis Date Noted  . Bilateral knee pain 02/03/2016  . Right shoulder injury 05/18/2015  . Right wrist injury 05/18/2015  . Low back pain 05/18/2015    Marry GuanJoAnne M Vicki Chaffin, PT, MPT 02/26/2016, 12:47 PM  [facility] [address]  [address] [place], [place], [address] Phone: [phone]   Fax:   [phone]  Name: Marquise Lambson MRN: [id number] Date of Birth: November 30, 1985

## 2016-02-29 ENCOUNTER — Ambulatory Visit: Payer: Self-pay | Admitting: Physical Therapy

## 2016-03-02 ENCOUNTER — Ambulatory Visit: Payer: Self-pay

## 2016-03-04 ENCOUNTER — Ambulatory Visit: Payer: Self-pay | Admitting: Physical Therapy

## 2016-03-04 DIAGNOSIS — G8929 Other chronic pain: Secondary | ICD-10-CM

## 2016-03-04 DIAGNOSIS — M546 Pain in thoracic spine: Secondary | ICD-10-CM

## 2016-03-04 DIAGNOSIS — M6281 Muscle weakness (generalized): Secondary | ICD-10-CM

## 2016-03-04 DIAGNOSIS — M25511 Pain in right shoulder: Principal | ICD-10-CM

## 2016-03-04 DIAGNOSIS — M5441 Lumbago with sciatica, right side: Secondary | ICD-10-CM

## 2016-03-04 IMAGING — CR DG CHEST 2V
2 series · 2 of 2 positions shown · non-contrast
Comparison: None.

CLINICAL DATA: Cough and shortness of breath for 2 days. Epistaxis
last night. Nonsmoker.

EXAM:
CHEST  2 VIEW

[w chest pa]
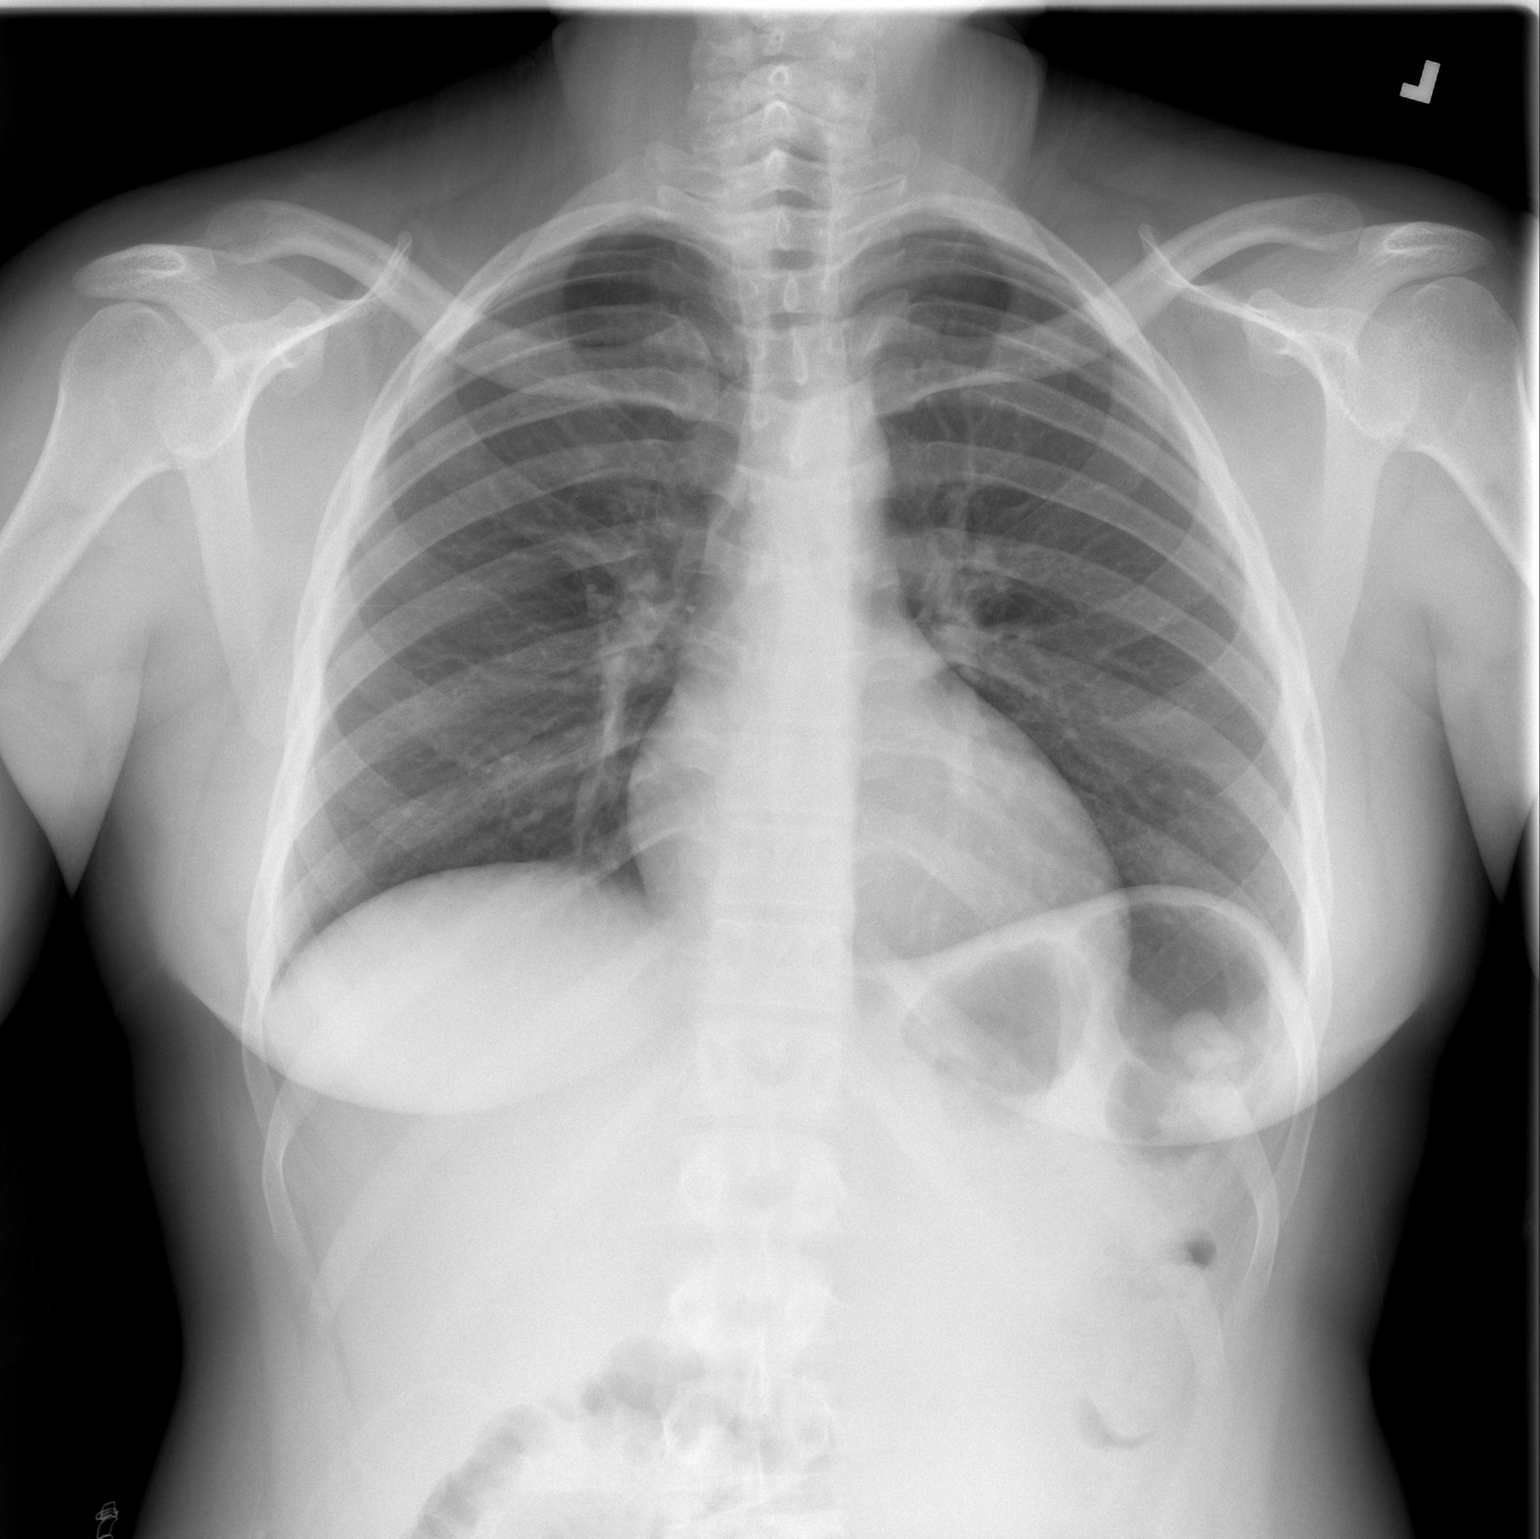

[w chest lat]
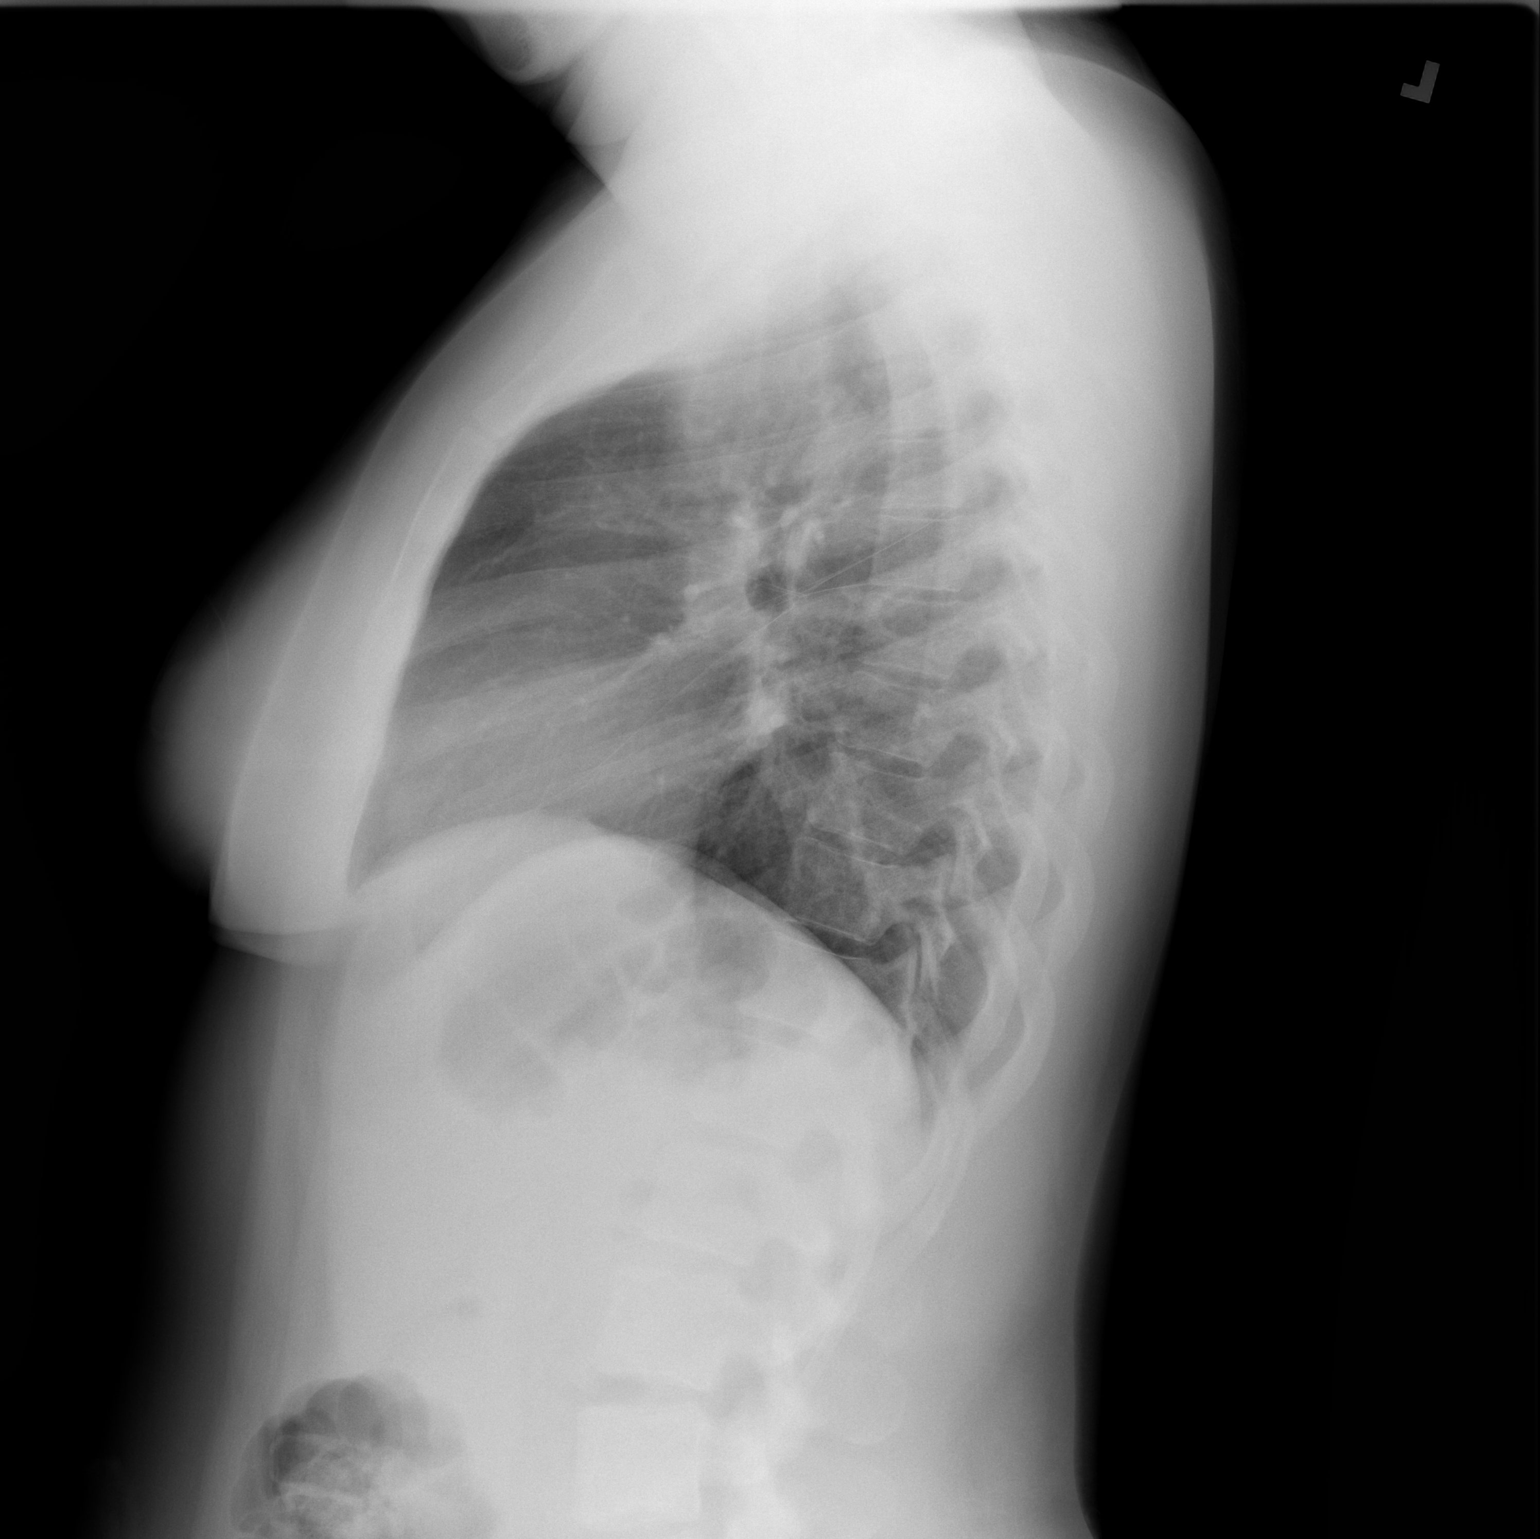

[2 of 2 positions shown; findings below may reference images not displayed]

FINDINGS: The heart size and mediastinal contours are within normal limits.
Both lungs are clear. The visualized skeletal structures are
unremarkable.
IMPRESSION: No active cardiopulmonary disease.

## 2016-03-04 NOTE — Therapy (Signed)
[facility] [address]  [address] [place], [place], [address] Phone: [phone]   Fax:  [phone]  Physical Therapy Treatment  Patient Details  Name: Brooke Silva MRN: [id number] Date of Birth: 12-Aug-1985 Referring Provider: Dr. Norton Blizzard   Encounter Date: 03/04/2016      PT End of Session - 03/04/16 1530    Visit Number 2   Number of Visits 12   Date for PT Re-Evaluation 04/08/16   Authorization Type [facility]   Authorization Time Period termination date: 04-10-16   PT Start Time 1527   PT Stop Time 1609   PT Time Calculation (min) 42 min   Activity Tolerance Patient tolerated treatment well   Behavior During Therapy [facility] for tasks assessed/performed      No past medical history on file.  No past surgical history on file.  There were no vitals filed for this visit.      Subjective Assessment - 03/04/16 1528    Subjective Patient with continued R shoulder pain - feels like this pain is worse than hip/back area   Diagnostic tests MRI lumbar spine 01/21/16: Mild lumbar spondylosis.  No compressive lesion or subluxation.   Patient Stated Goals "stop being in pain"   Currently in Pain? Yes   Pain Score 7    Pain Location Shoulder   Pain Orientation Right   Pain Descriptors / Indicators Aching;Dull   Pain Type Chronic pain   Pain Onset More than a month ago   Pain Frequency Constant   Aggravating Factors  lifting, pushing (65-70 lbs), cold increase aching   Pain Relieving Factors Meloxacam                         [facility] Adult PT Treatment/Exercise - 03/04/16 1533      Lumbar Exercises: Standing   Other Standing Lumbar Exercises paloff press - blue tband in door - 15 reps each side     Shoulder Exercises: Seated   External Rotation Strengthening;Both;15 reps;Theraband   Theraband Level (Shoulder External Rotation) Level 2 (Red)   External Rotation Limitations with elbows by  side - scap squeeze     Shoulder Exercises: Sidelying   External Rotation Strengthening;Right;15 reps;Weights   External Rotation Weight (lbs) 2   External Rotation Limitations with scap squeeze   ABduction Strengthening;Right;15 reps;Weights   ABduction Weight (lbs) 2     Shoulder Exercises: Standing   Horizontal ABduction Strengthening;Both;15 reps;Theraband   Theraband Level (Shoulder Horizontal ABduction) Level 3 (Green)   Horizontal ABduction Limitations with scap squeeze   External Rotation Right;Both;15 reps;Theraband   Theraband Level (Shoulder External Rotation) Level 2 (Red)   Internal Rotation Strengthening;Both;15 reps;Theraband   Theraband Level (Shoulder Internal Rotation) Level 2 (Red)     Shoulder Exercises: ROM/Strengthening   UBE (Upper Arm Bike) level 2 x 6 minutes (3/3)   Cybex Row Limitations 25# x 15 reps with 5 second hold - narrow grip   Other ROM/Strengthening Exercises BATCA pull down x 15 reps - 20#   Other ROM/Strengthening Exercises BATCA push up plus x 15 reps - 20#     Shoulder Exercises: Stretch   Corner Stretch 2 reps;30 seconds   Corner Stretch Limitations low/mid/high doorway stretch                  PT Short Term Goals - 02/26/16 1017      PT SHORT TERM GOAL #1   Title  Independent with initial HEP by 03/11/16   Status New           PT Long Term Goals - 02/26/16 1017      PT LONG TERM GOAL #1   Title Independent with advanced HEP as indicated by 04/08/16   Status New     PT LONG TERM GOAL #2   Title B shoulder, hip and knee strength >/= 4/5 for improved postural stability by 04/08/16   Status New     PT LONG TERM GOAL #3   Title Pt able to demonstrate proper posture and bodymechanics for lifting, pushing and pulling to improve safety with job performance by 04/08/16   Status New     PT LONG TERM GOAL #4   Title Pt will report ability to complete her household chores and job tasks with >/= 50% reduction in pain by 04/08/16    Status New               Plan - 03/04/16 1531    Clinical Impression Statement Patient well today - having some R shoulder pain that has been persistent. Patient with subjective reports of reduced compliance with initial HEP given at time of initial eval. PT session today focusing on shoulder/back strengthening with some core tasks added in. Patient tolerating all resisted exercises today with no increase in pain, only subjective reports of "tightness" within R shoulder complex. Patient to continue to benefit from PT to maximize functional use of R UE.    PT Treatment/Interventions Patient/family education;Neuromuscular re-education;Therapeutic exercise;Manual techniques;Taping;Dry needling;Therapeutic activities;Electrical Stimulation;Moist Heat;Iontophoresis 4mg /ml Dexamethasone;ADLs/Self Care Home Management   Consulted and Agree with Plan of Care Patient      Patient will benefit from skilled therapeutic intervention in order to improve the following deficits and impairments:  Pain, Impaired flexibility, Decreased strength, Decreased range of motion, Postural dysfunction, Improper body mechanics, Decreased activity tolerance, Increased muscle spasms  Visit Diagnosis: Chronic right shoulder pain  Pain in thoracic spine  Chronic midline low back pain with right-sided sciatica  Muscle weakness (generalized)     Problem List Patient Active Problem List   Diagnosis Date Noted  . Bilateral knee pain 02/03/2016  . Right shoulder injury 05/18/2015  . Right wrist injury 05/18/2015  . Low back pain 05/18/2015     Kipp LaurenceStephanie R Geneva Pallas, PT, DPT 03/04/16 4:10 PM   [facility] [address]  [address] [place], [place], [address] Phone: [phone]   Fax:  [phone]  Name: Brooke Silva MRN: [id number] Date of Birth: [birth year]

## 2016-03-08 ENCOUNTER — Ambulatory Visit: Payer: Self-pay | Admitting: Physical Therapy

## 2016-03-08 DIAGNOSIS — M25511 Pain in right shoulder: Principal | ICD-10-CM

## 2016-03-08 DIAGNOSIS — G8929 Other chronic pain: Secondary | ICD-10-CM

## 2016-03-08 DIAGNOSIS — M6281 Muscle weakness (generalized): Secondary | ICD-10-CM

## 2016-03-08 DIAGNOSIS — M546 Pain in thoracic spine: Secondary | ICD-10-CM

## 2016-03-08 DIAGNOSIS — M5441 Lumbago with sciatica, right side: Secondary | ICD-10-CM

## 2016-03-08 NOTE — Therapy (Signed)
[facility] [address]  [address] [place], [place], [address] Phone: [phone]   Fax:  [phone]  Physical Therapy Treatment  Patient Details  Name: Brooke Silva MRN: [id number] Date of Birth: April 08, 1985 Referring Provider: Dr. Norton BlizzardShane Hudnall   Encounter Date: 03/08/2016      PT End of Session - 03/08/16 1745    Visit Number 3   Number of Visits 12   Date for PT Re-Evaluation 04/08/16   Authorization Type [facility]   Authorization Time Period termination date: 04-10-16   PT Start Time 0500   PT Stop Time 0543   PT Time Calculation (min) 43 min   Activity Tolerance Patient tolerated treatment well   Behavior [facility] for tasks assessed/performed      No past medical history on file.  No past surgical history on file.  There were no vitals filed for this visit.      Subjective Assessment - 03/08/16 1704    Subjective Pt reports R shoulder continues to hurt and that she sometimes has pain in lower back but shoulder is worse.   Patient Stated Goals "stop being in pain"   Currently in Pain? Yes   Pain Score 6    Pain Location Shoulder   Pain Orientation Right   Pain Descriptors / Indicators Dull;Aching   Pain Type Chronic pain   Multiple Pain Sites Yes   Pain Score 6   Pain Location Back   Pain Orientation Upper;Lower   Pain Descriptors / Indicators Aching;Dull                         [facility] Adult PT Treatment/Exercise - 03/08/16 1659      Lumbar Exercises: Seated   Hip Flexion on Ball Both;15 reps   Hip Flexion on Ball Limitations on orange ball with red TB + ab contraction with hand on table for UE support     Lumbar Exercises: Supine   Ab Set 5 seconds;10 reps   AB Set Limitations with pelvic tilt   Clam 15 reps;3 seconds   Clam Limitations with ab contraction   Bridge 10 reps;5 seconds   Bridge Limitations with ab contraction   Other Supine Lumbar Exercises  Bridges with Ball squeeze; 10 reps; 5 sec holds     Lumbar Exercises: Sidelying   Clam 5 seconds;15 reps   Clam Limitations both sides; red TB + ab contraction     Shoulder Exercises: Standing   Row Both;10 reps;Theraband   Theraband Level (Shoulder Row) Level 2 (Red);Level 3 (Green)   Row Limitations 5" holds; 5 reps with red TB; 5 reps with green TB + ab contraction     Shoulder Exercises: ROM/Strengthening   UBE (Upper Arm Bike) lvl 2 x 6' (3/3)                PT Education - 03/08/16 1745    Education provided Yes   Education Details update HEP   Person(s) Educated Patient   Methods Explanation;Demonstration;Handout   Comprehension Verbalized understanding;Returned demonstration          PT Short Term Goals - 03/08/16 1746      PT SHORT TERM GOAL #1   Title Independent with initial HEP by 03/11/16   Status On-going           PT Long Term Goals - 03/08/16 1747      PT LONG TERM GOAL #1  Title Independent with advanced HEP as indicated by 04/08/16   Status On-going     PT LONG TERM GOAL #2   Title B shoulder, hip and knee strength >/= 4/5 for improved postural stability by 04/08/16   Status On-going     PT LONG TERM GOAL #3   Title Pt able to demonstrate proper posture and bodymechanics for lifting, pushing and pulling to improve safety with job performance by 04/08/16   Status On-going     PT LONG TERM GOAL #4   Title Pt will report ability to complete her household chores and job tasks with >/= 50% reduction in pain by 04/08/16   Status On-going               Plan - 03/08/16 1746    Clinical Impression Statement Pt returned to therapy today with reports that she has been doing her HEP regularly. She does report that R shoulder pain is the worst but gives equal pain ratings for shoulder and back pain. Today's session focused on the introduction of core/LE strengthening with the pt reporting a tight feeling between shoulder blades but no  increased pain. Pt was able to tolerate today's session well and will continue to benefit from core/LE strengthening and scapular stabilization exercises.     Rehab Potential Good   Clinical Impairments Affecting Rehab Potential chronicity of pain (>18 months)   PT Treatment/Interventions Patient/family education;Neuromuscular re-education;Therapeutic exercise;Manual techniques;Taping;Dry needling;Therapeutic activities;Electrical Stimulation;Moist Heat;Iontophoresis 4mg /ml Dexamethasone;ADLs/Self Care Home Management   PT Next Visit Plan Review updated HEP; continue with core/LE strengthening and scapular stabilization strengthening; modalities PRN   Consulted and Agree with Plan of Care Patient      Patient will benefit from skilled therapeutic intervention in order to improve the following deficits and impairments:  Pain, Impaired flexibility, Decreased strength, Decreased range of motion, Postural dysfunction, Improper body mechanics, Decreased activity tolerance, Increased muscle spasms  Visit Diagnosis: Chronic right shoulder pain  Pain in thoracic spine  Chronic midline low back pain with right-sided sciatica  Muscle weakness (generalized)     Problem List Patient Active Problem List   Diagnosis Date Noted  . Bilateral knee pain 02/03/2016  . Right shoulder injury 05/18/2015  . Right wrist injury 05/18/2015  . Low back pain 05/18/2015    Katheran James, SPT 03/08/2016, 5:58 PM  [facility] [address]  [address] [place], [place], [address] Phone: [phone]   Fax:  [phone]  Name: Brooke Silva MRN: [id number] Date of Birth: [birth year]

## 2016-03-09 ENCOUNTER — Ambulatory Visit: Payer: Self-pay

## 2016-03-09 DIAGNOSIS — M25511 Pain in right shoulder: Principal | ICD-10-CM

## 2016-03-09 DIAGNOSIS — M6281 Muscle weakness (generalized): Secondary | ICD-10-CM

## 2016-03-09 DIAGNOSIS — G8929 Other chronic pain: Secondary | ICD-10-CM

## 2016-03-09 DIAGNOSIS — M5441 Lumbago with sciatica, right side: Secondary | ICD-10-CM

## 2016-03-09 DIAGNOSIS — M546 Pain in thoracic spine: Secondary | ICD-10-CM

## 2016-03-09 NOTE — Patient Instructions (Signed)

## 2016-03-09 NOTE — Therapy (Signed)
[facility] Outpatient Rehabilitation [facility] [address]  [address] [place], [place], [address] Phone: [phone]   Fax:  [phone]  Physical Therapy Treatment  Patient Details  Name: Brooke Silva MRN: [id number] Date of Birth: 09-Mar-1985 Referring Provider: Dr. Norton Blizzard   Encounter Date: 03/09/2016      PT End of Session - 03/09/16 1630    Visit Number 4   Number of Visits 12   Date for PT Re-Evaluation 04/08/16   Authorization Type [facility] [employer]   Authorization Time Period termination date: 04-10-16   PT Start Time 1624  Pt. arrived late to tx   PT Stop Time 1712  moist heat to end treatment    PT Time Calculation (min) 48 min   Activity Tolerance Patient tolerated treatment well   Behavior During Therapy [facility] for tasks assessed/performed      No past medical history on file.  No past surgical history on file.  There were no vitals filed for this visit.      Subjective Assessment - 03/09/16 1627    Subjective R posterior shoulder pain as chief complaint today    Patient Stated Goals "stop being in pain"   Currently in Pain? Yes   Pain Score 7    Pain Location Shoulder   Pain Orientation Right   Pain Descriptors / Indicators Dull;Aching   Pain Type Chronic pain   Pain Onset More than a month ago   Pain Frequency Constant   Aggravating Factors  lifting, pulling   Pain Relieving Factors meloxacam   Multiple Pain Sites No           [facility] Adult PT Treatment/Exercise - 03/09/16 1647      Lumbar Exercises: Stretches   Passive Hamstring Stretch 1 rep;30 seconds   Passive Hamstring Stretch Limitations with therapist    Piriformis Stretch 2 reps;30 seconds   Piriformis Stretch Limitations KTOS & Figure 4     Lumbar Exercises: Standing   Functional Squats 10 reps;3 seconds   Functional Squats Limitations on TRX  cues provided for upright posture     Lumbar Exercises: Supine   Bridge 15 reps;5 seconds   Bridge  Limitations with sustained B hip abd/ER with green TB      Lumbar Exercises: Sidelying   Clam 5 seconds;15 reps   Clam Limitations Bilateral; green TB      Shoulder Exercises: Standing   Extension AROM;Both;10 reps;Theraband   Theraband Level (Shoulder Extension) Level 3 (Green)   Extension Limitations tactile cues required for scap. squeeze   Row Both;10 reps;Theraband   Theraband Level (Shoulder Row) Level 3 (Green)   Row Limitations 5" hold     Shoulder Exercises: ROM/Strengthening   UBE (Upper Arm Bike) lvl 2.5 x 6' (3/3)     Modalities   Modalities Moist Heat     Moist Heat Therapy   Number Minutes Moist Heat 10 Minutes   Moist Heat Location Shoulder  R shoulder in hooklying             PT Education - 03/09/16 1814    Education provided Yes   Education Details Posture education handout    Person(s) Educated Patient   Methods Explanation;Demonstration;Handout;Verbal cues   Comprehension Verbalized understanding          PT Short Term Goals - 03/09/16 1814      PT SHORT TERM GOAL #1   Title Independent with initial HEP by 03/11/16   Status Achieved  PT Long Term Goals - 03/08/16 1747      PT LONG TERM GOAL #1   Title Independent with advanced HEP as indicated by 04/08/16   Status On-going     PT LONG TERM GOAL #2   Title B shoulder, hip and knee strength >/= 4/5 for improved postural stability by 04/08/16   Status On-going     PT LONG TERM GOAL #3   Title Pt able to demonstrate proper posture and bodymechanics for lifting, pushing and pulling to improve safety with job performance by 04/08/16   Status On-going     PT LONG TERM GOAL #4   Title Pt will report ability to complete her household chores and job tasks with >/= 50% reduction in pain by 04/08/16   Status On-going               Plan - 03/09/16 1642    Clinical Impression Statement R posterior shoulder pain at 7/10 initially today which remained pt. chief complaint  throughout treatment.  Pt. reporting consistent adherence to HEP without issue.  Mid back strengthening activity continued today along with LE manual stretching.  Significant time taken today reviewing postural handout with pt.  Proper lifting and squatting technique reviewed with pt. as well as demonstration of technique on TRX cable.  Pt. with pain unchanged throughout therex.  Moist heat applied to R shoulder to conclude treatment with pt. reporting pain free following this.     PT Treatment/Interventions Patient/family education;Neuromuscular re-education;Therapeutic exercise;Manual techniques;Taping;Dry needling;Therapeutic activities;Electrical Stimulation;Moist Heat;Iontophoresis 4mg /ml Dexamethasone;ADLs/Self Care Home Management   PT Next Visit Plan Continue with core/LE strengthening and scapular stabilization strengthening; modalities PRN      Patient will benefit from skilled therapeutic intervention in order to improve the following deficits and impairments:  Pain, Impaired flexibility, Decreased strength, Decreased range of motion, Postural dysfunction, Improper body mechanics, Decreased activity tolerance, Increased muscle spasms  Visit Diagnosis: Chronic right shoulder pain  Pain in thoracic spine  Chronic midline low back pain with right-sided sciatica  Muscle weakness (generalized)     Problem List Patient Active Problem List   Diagnosis Date Noted  . Bilateral knee pain 02/03/2016  . Right shoulder injury 05/18/2015  . Right wrist injury 05/18/2015  . Low back pain 05/18/2015    Kermit BaloMicah Dannie Woolen, PTA 03/09/16 6:30 PM   [facility] [place] [address]  [address] [place], [place], [address] Phone: [phone]   Fax:  [phone]  Name: Brooke Silva MRN: [id number] Date of Birth: 1985/06/15

## 2016-03-15 ENCOUNTER — Ambulatory Visit: Payer: Self-pay

## 2016-03-15 DIAGNOSIS — M6281 Muscle weakness (generalized): Secondary | ICD-10-CM

## 2016-03-15 DIAGNOSIS — M5441 Lumbago with sciatica, right side: Secondary | ICD-10-CM

## 2016-03-15 DIAGNOSIS — M25511 Pain in right shoulder: Principal | ICD-10-CM

## 2016-03-15 DIAGNOSIS — M546 Pain in thoracic spine: Secondary | ICD-10-CM

## 2016-03-15 DIAGNOSIS — G8929 Other chronic pain: Secondary | ICD-10-CM

## 2016-03-15 NOTE — Therapy (Addendum)
[facility] [place] [address]  [address] [place], [place], [address] Phone: [phone]   Fax:  [phone]  Physical Therapy Treatment  Patient Details  Name: Brooke RopesKimyatta Silva MRN: [id number] Date of Birth: [birth year] Referring Provider: Dr. Norton BlizzardShane Hudnall   Encounter Date: 03/15/2016      PT End of Session - 03/15/16 1711    Visit Number 5   Number of Visits 12   Date for PT Re-Evaluation 04/08/16   Authorization Type [facility] [employer]   Authorization Time Period termination date: 04-10-16   PT Start Time 1706   PT Stop Time 1804  moist heat to end treatment   PT Time Calculation (min) 58 min   Activity Tolerance Patient tolerated treatment well   Behavior During Therapy [facility] for tasks assessed/performed      No past medical history on file.  No past surgical history on file.  There were no vitals filed for this visit.      Subjective Assessment - 03/15/16 1707    Subjective Pt. noting R posterior shoulder pain still bothering her with lifting activities.     Patient Stated Goals "stop being in pain"   Currently in Pain? Yes   Pain Score 7    Pain Location Shoulder   Pain Orientation Right   Pain Descriptors / Indicators Aching;Dull   Pain Type Chronic pain   Pain Radiating Towards n/a today   Pain Onset More than a month ago   Pain Frequency Constant   Aggravating Factors  lifting, pulling    Multiple Pain Sites No              [facility] Adult PT Treatment/Exercise - 03/15/16 1731      Lumbar Exercises: Stretches   Passive Hamstring Stretch 30 seconds;2 reps   Passive Hamstring Stretch Limitations with therapist    Piriformis Stretch 2 reps;30 seconds   Piriformis Stretch Limitations KTOS & Figure 4     Lumbar Exercises: Standing   Functional Squats 15 reps   Functional Squats Limitations on TRX  cues provided for upright posture     Shoulder Exercises: Standing   Horizontal ABduction  Strengthening;Both;15 reps;Theraband   Theraband Level (Shoulder Horizontal ABduction) Level 3 (Green)   Horizontal ABduction Limitations 5" scap. squeeze    Extension AROM;Both;10 reps;Theraband   Theraband Level (Shoulder Extension) Level 3 (Green)   Extension Limitations 5" hold    Other Standing Exercises B shoulder horizontal abduction and alternating flexion/ext (X-pattern) with blue TB laying on 6" bolster x 15 reps     Shoulder Exercises: ROM/Strengthening   UBE (Upper Arm Bike) lvl 3.0 x 6' (3/3)     Shoulder Exercises: Lawyertretch   Corner Stretch 2 reps;30 seconds   Corner Stretch Limitations low/mid/high doorway stretch   Other Shoulder Stretches chest stretchSupine laying on 6" bolster      Modalities   Modalities Moist Heat     Moist Heat Therapy   Number Minutes Moist Heat 10 Minutes   Moist Heat Location Shoulder  R shoulder and lumbar spine in hooklying      Manual Therapy   Manual Therapy Soft tissue mobilization;Myofascial release   Soft tissue mobilization to Levator Scap, UT    Myofascial Release TPR to Levator scap, UT, R medial scap. border            PT Short Term Goals - 03/09/16 1814      PT SHORT TERM GOAL #1   Title Independent with  initial HEP by 03/11/16   Status Achieved           PT Long Term Goals - 03/08/16 1747      PT LONG TERM GOAL #1   Title Independent with advanced HEP as indicated by 04/08/16   Status On-going     PT LONG TERM GOAL #2   Title B shoulder, hip and knee strength >/= 4/5 for improved postural stability by 04/08/16   Status On-going     PT LONG TERM GOAL #3   Title Pt able to demonstrate proper posture and bodymechanics for lifting, pushing and pulling to improve safety with job performance by 04/08/16   Status On-going     PT LONG TERM GOAL #4   Title Pt will report ability to complete her household chores and job tasks with >/= 50% reduction in pain by 04/08/16   Status On-going               Plan -  03/15/16 1814    Clinical Impression Statement Today's treatment focusing on anterior chest stretching, scapular strengthening, and continued instruction on proper squatting form.  Pt. requiring mod cues for upright posture with squatting today and would benefit from further review with this.  Pt. tolerating 6" bolster with chest stretch well today and confirming appropriateness of stretch.  Pt. noting R posterior shoulder pain throughout therex today and STM/TPR revealing continued R UT tightness and guarding.  TPR/STM to R UT, Levator Scap. and medial scapular border today with good pt. response.  Treatment ending with moist heat to lumbar spine and R posterior shoulder due to pt. reporting benefit from this last visit.  Will continue to progress core, mid back, and LE strengthening per pt. tolerance and review proper lifting technique prn.     PT Treatment/Interventions Patient/family education;Neuromuscular re-education;Therapeutic exercise;Manual techniques;Taping;Dry needling;Therapeutic activities;Electrical Stimulation;Moist Heat;Iontophoresis 4mg /ml Dexamethasone;ADLs/Self Care Home Management   PT Next Visit Plan Continue with core/LE strengthening and scapular stabilization strengthening; review proper lifting technique prn; modalities PRN      Patient will benefit from skilled therapeutic intervention in order to improve the following deficits and impairments:  Pain, Impaired flexibility, Decreased strength, Decreased range of motion, Postural dysfunction, Improper body mechanics, Decreased activity tolerance, Increased muscle spasms  Visit Diagnosis: Chronic right shoulder pain  Pain in thoracic spine  Chronic midline low back pain with right-sided sciatica  Muscle weakness (generalized)     Problem List Patient Active Problem List   Diagnosis Date Noted  . Bilateral knee pain 02/03/2016  . Right shoulder injury 05/18/2015  . Right wrist injury 05/18/2015  . Low back pain  05/18/2015    Kermit Balo, PTA 03/15/16 6:23 PM  [facility] [address]  [address] [place], [place], [address] Phone: [phone]   Fax:  [phone]  Name: Brooke Silva MRN: [id number] Date of Birth: 1985/04/24

## 2016-03-17 ENCOUNTER — Ambulatory Visit: Payer: Self-pay | Attending: Family Medicine | Admitting: Physical Therapy

## 2016-03-17 DIAGNOSIS — M25511 Pain in right shoulder: Secondary | ICD-10-CM | POA: Insufficient documentation

## 2016-03-17 DIAGNOSIS — M546 Pain in thoracic spine: Secondary | ICD-10-CM

## 2016-03-17 DIAGNOSIS — G8929 Other chronic pain: Secondary | ICD-10-CM

## 2016-03-17 DIAGNOSIS — M5441 Lumbago with sciatica, right side: Secondary | ICD-10-CM | POA: Insufficient documentation

## 2016-03-17 DIAGNOSIS — M6281 Muscle weakness (generalized): Secondary | ICD-10-CM

## 2016-03-17 NOTE — Therapy (Signed)
[facility] [place] [address]  [address] [place], [place], [address] Phone: [phone]   Fax:  [phone]  Physical Therapy Treatment  Patient Details  Name: Brooke RopesKimyatta Balaban MRN: [id number] Date of Birth: 12-12-1985 Referring Provider: Dr. Norton BlizzardShane Hudnall   Encounter Date: 03/17/2016      PT End of Session - 03/17/16 1410    Visit Number 6   Number of Visits 12   Date for PT Re-Evaluation 04/08/16   Authorization Type [facility]   Authorization Time Period termination date: 04-10-16   PT Start Time 1403   PT Stop Time 1441   PT Time Calculation (min) 38 min   Activity Tolerance Patient tolerated treatment well   Behavior During Therapy [facility] for tasks assessed/performed      No past medical history on file.  No past surgical history on file.  There were no vitals filed for this visit.      Subjective Assessment - 03/17/16 1407    Subjective Pt reports pain is not bad today. She is reporting it is mainly in her legs and her wrist right now.    Patient Stated Goals "stop being in pain"   Currently in Pain? Yes   Pain Score 4    Pain Location Shoulder   Pain Score 5   Pain Location Back   Pain Orientation Lower   Pain Descriptors / Indicators Aching;Dull   Pain Type Chronic pain   Pain Radiating Towards pain shoots down R leg toward ankle with Numbness   Pain Onset More than a month ago   Pain Frequency Intermittent   Aggravating Factors  Doing manual labor   Pain Relieving Factors Stretching   Effect of Pain on Daily Activities still do everything but must rest if needed                         [facility] Treatment/Exercise - 03/17/16 1404      Lumbar Exercises: Standing   Functional Squats 15 reps;5 seconds   Functional Squats Limitations At counter for UE support     Lumbar Exercises: Supine   Bridge 15 reps   Bridge Limitations green TB with alt hip abd/ER   Other Supine Lumbar  Exercises Bridge with hamstring curl on peanut ball; 10 reps     Lumbar Exercises: Prone   Straight Leg Raise --   Straight Leg Raises Limitations --     Lumbar Exercises: Quadruped   Straight Leg Raise 5 reps;5 seconds   Straight Leg Raises Limitations + ab set     Shoulder Exercises: Standing   Extension Both;10 reps;Theraband   Theraband Level (Shoulder Extension) Level 3 (Green)   Extension Limitations 5" holds     Shoulder Exercises: ROM/Strengthening   UBE (Upper Arm Bike) lvl 3.0 X 6' (3/3)                  PT Short Term Goals - 03/09/16 1814      PT SHORT TERM GOAL #1   Title Independent with initial HEP by 03/11/16   Status Achieved           PT Long Term Goals - 03/08/16 1747      PT LONG TERM GOAL #1   Title Independent with advanced HEP as indicated by 04/08/16   Status On-going     PT LONG TERM GOAL #2   Title B shoulder, hip and knee strength >/=  4/5 for improved postural stability by 04/08/16   Status On-going     PT LONG TERM GOAL #3   Title Pt able to demonstrate proper posture and bodymechanics for lifting, pushing and pulling to improve safety with job performance by 04/08/16   Status On-going     PT LONG TERM GOAL #4   Title Pt will report ability to complete her household chores and job tasks with >/= 50% reduction in pain by 04/08/16   Status On-going               Plan - 03/17/16 1705    Clinical Impression Statement Pt returned to therapy with primary complaints of R radicular leg pain but reports her back and shoulder pain are not too bad today. Today's session focused on progressing core/LE & scapular strengthening. Pt was able to tolerate progression of activities on the mat. Squats were continued today but at the counter for introduction into HEP. Upon completion of squats, pt reports she began feeling light headed but stated it was due to medication she just began taking. She stated she has just begun taking Prednisone and  Flagyl. Pt's vitals were checked with pulse ox being 96 and BP 117/70. She was saying she was fine after resting. Two more exercises were performed with pt reporting she was doing fine. Following treatment, she reported no increase in pain and no reports of continued light headedness. She will continue to benefit from core/LE strengthening and Scapular stabilization exercises as well as proper lifting techniques.    Rehab Potential Good   Clinical Impairments Affecting Rehab Potential chronicity of pain (>18 months)   PT Treatment/Interventions Patient/family education;Neuromuscular re-education;Therapeutic exercise;Manual techniques;Taping;Dry needling;Therapeutic activities;Electrical Stimulation;Moist Heat;Iontophoresis 4mg /ml Dexamethasone;ADLs/Self Care Home Management   PT Next Visit Plan Continue with core/LE strengthening and scapular stabilization strengthening; modalities & manual therapy PRN   Consulted and Agree with Plan of Care Patient      Patient will benefit from skilled therapeutic intervention in order to improve the following deficits and impairments:  Pain, Impaired flexibility, Decreased strength, Decreased range of motion, Postural dysfunction, Improper body mechanics, Decreased activity tolerance, Increased muscle spasms  Visit Diagnosis: Chronic right shoulder pain  Pain in thoracic spine  Chronic midline low back pain with right-sided sciatica  Muscle weakness (generalized)     Problem List Patient Active Problem List   Diagnosis Date Noted  . Bilateral knee pain 02/03/2016  . Right shoulder injury 05/18/2015  . Right wrist injury 05/18/2015  . Low back pain 05/18/2015    Katheran James, SPT 03/17/2016, 6:42 PM  [facility] [address]  [address] [place], [place], [address] Phone: [phone]   Fax:  [phone]  Name: Veryl Abril MRN: [id number] Date of Birth: 1985-03-01

## 2016-03-21 ENCOUNTER — Ambulatory Visit: Payer: Self-pay

## 2016-03-21 DIAGNOSIS — M25511 Pain in right shoulder: Principal | ICD-10-CM

## 2016-03-21 DIAGNOSIS — G8929 Other chronic pain: Secondary | ICD-10-CM

## 2016-03-21 DIAGNOSIS — M6281 Muscle weakness (generalized): Secondary | ICD-10-CM

## 2016-03-21 DIAGNOSIS — M546 Pain in thoracic spine: Secondary | ICD-10-CM

## 2016-03-21 DIAGNOSIS — M5441 Lumbago with sciatica, right side: Secondary | ICD-10-CM

## 2016-03-21 NOTE — Therapy (Signed)
[facility] Outpatient Rehabilitation [facility] [address]  [address] [place], [place], [address] Phone: [phone]   Fax:  [phone]  Physical Therapy Treatment  Patient Details  Name: Brooke Silva MRN: [id number] Date of Birth: March 24, 1985 Referring Provider: Dr. Norton Blizzard   Encounter Date: 03/21/2016      PT End of Session - 03/21/16 1702    Visit Number 7   Number of Visits 12   Date for PT Re-Evaluation 04/08/16   Authorization Type [facility]   Authorization Time Period termination date: 04-10-16   PT Start Time 1700   PT Stop Time 1743   PT Time Calculation (min) 43 min   Activity Tolerance Patient tolerated treatment well   Behavior During Therapy [facility] for tasks assessed/performed      No past medical history on file.  No past surgical history on file.  There were no vitals filed for this visit.      Subjective Assessment - 03/21/16 1702    Subjective Pt. reporting she has been pain free since Friday however has not performed the HEP due to being gone to the beach with her kids.   Patient Stated Goals "stop being in pain"   Currently in Pain? No/denies   Pain Score 0-No pain   Multiple Pain Sites No              [facility] Adult PT Treatment/Exercise - 03/21/16 1710      Lumbar Exercises: Aerobic   Stationary Bike NuStep: level 5, 6 min      Lumbar Exercises: Standing   Other Standing Lumbar Exercises paloff press with double green TB blue tband in door - 15 reps each side   Other Standing Lumbar Exercises B fitter hip extension (1 blue, 1 black) x 10 reps each      Lumbar Exercises: Supine   Bridge 15 reps   Bridge Limitations blue TB with sustained B hip abd/ER with blue TB 5" x 10 reps   Other Supine Lumbar Exercises Bridge with hamstring curl on peanut ball; 2 x 12 reps   Other Supine Lumbar Exercises Seated single arm row with green TB on green p-ball (65cm) x 10 reps each side       Lumbar Exercises: Quadruped    Straight Leg Raise 5 reps;5 seconds   Straight Leg Raises Limitations + ab set   Opposite Arm/Leg Raise 5 seconds;5 reps;Right arm/Left leg;Left arm/Right leg   Opposite Arm/Leg Raise Limitations with cues for abdom. bracing     Shoulder Exercises: Seated   Other Seated Exercises B scapular retraction/extension sitting on green p-ball (65cm) with green TB x 10 reps  3" hold      Shoulder Exercises: Standing   Extension Both;Theraband;15 reps   Theraband Level (Shoulder Extension) Level 3 (Green)   Extension Limitations 3" hold   Other Standing Exercises Standing Pallof press with double green TB x 10 reps each way            PT Short Term Goals - 03/09/16 1814      PT SHORT TERM GOAL #1   Title Independent with initial HEP by 03/11/16   Status Achieved           PT Long Term Goals - 03/08/16 1747      PT LONG TERM GOAL #1   Title Independent with advanced HEP as indicated by 04/08/16   Status On-going     PT LONG TERM GOAL #2   Title  B shoulder, hip and knee strength >/= 4/5 for improved postural stability by 04/08/16   Status On-going     PT LONG TERM GOAL #3   Title Pt able to demonstrate proper posture and bodymechanics for lifting, pushing and pulling to improve safety with job performance by 04/08/16   Status On-going     PT LONG TERM GOAL #4   Title Pt will report ability to complete her household chores and job tasks with >/= 50% reduction in pain by 04/08/16   Status On-going               Plan - 03/21/16 1707    Clinical Impression Statement Pt. reporting she has been pain free over weekend however has not performed HEP due to being gone to beach.   Today's treatment with advancement of lumbopelvic/core strengthening activity with pt. tolerated very well.  Pt. able to perform increased reps with quadruped activity and seated p-ball stability activities without issue today and seems to be progressing well.  Will plan to monitor adherence to HEP and  update prn in coming visits.  Pt. will continue to benefit from further skilled therapy to improve LE/core strength and functional capacity.      PT Treatment/Interventions Patient/family education;Neuromuscular re-education;Therapeutic exercise;Manual techniques;Taping;Dry needling;Therapeutic activities;Electrical Stimulation;Moist Heat;Iontophoresis 4mg /ml Dexamethasone;ADLs/Self Care Home Management   PT Next Visit Plan Continue with core/LE strengthening and scapular stabilization strengthening; modalities & manual therapy PRN      Patient will benefit from skilled therapeutic intervention in order to improve the following deficits and impairments:  Pain, Impaired flexibility, Decreased strength, Decreased range of motion, Postural dysfunction, Improper body mechanics, Decreased activity tolerance, Increased muscle spasms  Visit Diagnosis: Chronic right shoulder pain  Pain in thoracic spine  Chronic midline low back pain with right-sided sciatica  Muscle weakness (generalized)     Problem List Patient Active Problem List   Diagnosis Date Noted  . Bilateral knee pain 02/03/2016  . Right shoulder injury 05/18/2015  . Right wrist injury 05/18/2015  . Low back pain 05/18/2015    Kermit BaloMicah Siyana Erney, PTA 03/21/16 6:05 PM  [facility] [place] [address]  [address] [place], [place], [address] Phone: [phone]   Fax:  [phone]  Name: Brooke RopesKimyatta Silva MRN: [id number] Date of Birth: [birth year]

## 2016-03-24 ENCOUNTER — Ambulatory Visit: Payer: Self-pay

## 2016-03-24 DIAGNOSIS — M25511 Pain in right shoulder: Principal | ICD-10-CM

## 2016-03-24 DIAGNOSIS — G8929 Other chronic pain: Secondary | ICD-10-CM

## 2016-03-24 DIAGNOSIS — M6281 Muscle weakness (generalized): Secondary | ICD-10-CM

## 2016-03-24 DIAGNOSIS — M5441 Lumbago with sciatica, right side: Secondary | ICD-10-CM

## 2016-03-24 DIAGNOSIS — M546 Pain in thoracic spine: Secondary | ICD-10-CM

## 2016-03-24 NOTE — Therapy (Signed)
[facility] [address]  [address] [place], [place], [address] Phone: [phone]   Fax:  [phone]  Physical Therapy Treatment  Patient Details  Name: Brooke Silva MRN: [id number] Date of Birth: May 31, 1985 Referring Provider: Dr. Norton Blizzard   Encounter Date: 03/24/2016      PT End of Session - 03/24/16 1504    Visit Number 8   Number of Visits 12   Date for PT Re-Evaluation 04/08/16   Authorization Type [facility] [employer]   Authorization Time Period termination date: 04-10-16   PT Start Time 1456  Pt. arrived late    PT Stop Time 1532   PT Time Calculation (min) 36 min   Activity Tolerance Patient tolerated treatment well   Behavior During Therapy [facility] for tasks assessed/performed      No past medical history on file.  No past surgical history on file.  There were no vitals filed for this visit.      Subjective Assessment - 03/24/16 1458    Subjective Pt. reporting LBP and mid back pain has subsided since starting taking steriods.  R shoulder chief complaint today however no pain initially today.     Patient Stated Goals "stop being in pain"   Currently in Pain? No/denies   Pain Score 0-No pain   Multiple Pain Sites No          [facility] Adult PT Treatment/Exercise - 03/24/16 1509      Lumbar Exercises: Standing   Functional Squats 15 reps;5 seconds   Functional Squats Limitations with TRX with blue TB around knees    Forward Lunge 10 reps;3 seconds   Forward Lunge Limitations Heavy cues required for proper knee over toe technique and to maintain stable core     Lumbar Exercises: Quadruped   Straight Leg Raise 10 reps;5 seconds   Straight Leg Raises Limitations + ab set; tactile cues to maintain neutral core   Opposite Arm/Leg Raise Right arm/Left leg;Left arm/Right leg;10 reps;3 seconds   Opposite Arm/Leg Raise Limitations with cues for abdom. bracing     Shoulder Exercises: Seated   Other Seated  Exercises B scapular retraction/flexion (Y) sitting on green p-ball (65cm) with green TB 3" x 10 reps   Other Seated Exercises Unilateral row sitting on green P-ball (65cm) with narrow stance 5" x 10 reps     Shoulder Exercises: ROM/Strengthening   UBE (Upper Arm Bike) lvl 3.5 X 6' (3/3)            PT Education - 03/24/16 1534    Education provided Yes   Education Details Rhomboids stretch    Person(s) Educated Patient   Methods Explanation;Demonstration;Handout;Verbal cues   Comprehension Verbalized understanding;Returned demonstration;Verbal cues required;Need further instruction          PT Short Term Goals - 03/09/16 1814      PT SHORT TERM GOAL #1   Title Independent with initial HEP by 03/11/16   Status Achieved           PT Long Term Goals - 03/08/16 1747      PT LONG TERM GOAL #1   Title Independent with advanced HEP as indicated by 04/08/16   Status On-going     PT LONG TERM GOAL #2   Title B shoulder, hip and knee strength >/= 4/5 for improved postural stability by 04/08/16   Status On-going     PT LONG TERM GOAL #3   Title Pt able to demonstrate proper posture  and bodymechanics for lifting, pushing and pulling to improve safety with job performance by 04/08/16   Status On-going     PT LONG TERM GOAL #4   Title Pt will report ability to complete her household chores and job tasks with >/= 50% reduction in pain by 04/08/16   Status On-going           Plan - 03/24/16 1506    Clinical Impression Statement Pt. reports not having pain in lower/mid back for nearly a week however that R posterior shoulder has been bothering her at work.  Focus of today's treatment was scapular strengthening with core stabilization and addition of rhomboids stretch.  Pt. reporting some relief of posterior shoulder pain with rhomboids stretch thus this added to HEP.  Pt. next appointment scheduled for 2.19.18 however she plans to call and schedule appointments for this upcoming  week.  Pt. will continue to benefit from further UE, core, hip strengthening to improve stability with job related tasks and prevent re-injury.       PT Treatment/Interventions Patient/family education;Neuromuscular re-education;Therapeutic exercise;Manual techniques;Taping;Dry needling;Therapeutic activities;Electrical Stimulation;Moist Heat;Iontophoresis 4mg /ml Dexamethasone;ADLs/Self Care Home Management   PT Next Visit Plan Review of proper lifting technique; Continue with scapular stabilization strengthening; modalities & manual therapy PRN      Patient will benefit from skilled therapeutic intervention in order to improve the following deficits and impairments:  Pain, Impaired flexibility, Decreased strength, Decreased range of motion, Postural dysfunction, Improper body mechanics, Decreased activity tolerance, Increased muscle spasms  Visit Diagnosis: Chronic right shoulder pain  Pain in thoracic spine  Chronic midline low back pain with right-sided sciatica  Muscle weakness (generalized)     Problem List Patient Active Problem List   Diagnosis Date Noted  . Bilateral knee pain 02/03/2016  . Right shoulder injury 05/18/2015  . Right wrist injury 05/18/2015  . Low back pain 05/18/2015    Kermit BaloMicah Angeli Demilio, PTA 03/24/16 3:56 PM  [facility] [address]  [address] [place], [place], [address] Phone: [phone]   Fax:  [phone]  Name: Brooke RopesKimyatta Silva MRN: [id number] Date of Birth: [birth year]

## 2016-03-29 ENCOUNTER — Emergency Department (HOSPITAL_BASED_OUTPATIENT_CLINIC_OR_DEPARTMENT_OTHER)
Admission: EM | Admit: 2016-03-29 | Discharge: 2016-03-29 | Disposition: A | Discharge status: Home / Self Care | Payer: Self-pay | Attending: Emergency Medicine | Admitting: Emergency Medicine

## 2016-03-29 ENCOUNTER — Encounter (HOSPITAL_BASED_OUTPATIENT_CLINIC_OR_DEPARTMENT_OTHER): Payer: Self-pay | Admitting: Emergency Medicine

## 2016-03-29 ENCOUNTER — Emergency Department (HOSPITAL_BASED_OUTPATIENT_CLINIC_OR_DEPARTMENT_OTHER): Payer: Self-pay

## 2016-03-29 ENCOUNTER — Ambulatory Visit: Payer: Self-pay

## 2016-03-29 DIAGNOSIS — M25511 Pain in right shoulder: Principal | ICD-10-CM

## 2016-03-29 DIAGNOSIS — M6281 Muscle weakness (generalized): Secondary | ICD-10-CM

## 2016-03-29 DIAGNOSIS — K219 Gastro-esophageal reflux disease without esophagitis: Secondary | ICD-10-CM | POA: Insufficient documentation

## 2016-03-29 DIAGNOSIS — M5441 Lumbago with sciatica, right side: Secondary | ICD-10-CM

## 2016-03-29 DIAGNOSIS — M546 Pain in thoracic spine: Secondary | ICD-10-CM

## 2016-03-29 DIAGNOSIS — G8929 Other chronic pain: Secondary | ICD-10-CM

## 2016-03-29 LAB — CBC
HCT: 38 % (ref 36.0–46.0)
HEMOGLOBIN: 12.1 g/dL (ref 12.0–15.0)
MCH: 26.6 pg (ref 26.0–34.0)
MCHC: 31.8 g/dL (ref 30.0–36.0)
MCV: 83.5 fL (ref 78.0–100.0)
Platelets: 347 10*3/uL (ref 150–400)
RBC: 4.55 MIL/uL (ref 3.87–5.11)
RDW: 13.6 % (ref 11.5–15.5)
WBC: 10.3 10*3/uL (ref 4.0–10.5)

## 2016-03-29 LAB — BASIC METABOLIC PANEL
ANION GAP: 4 — AB (ref 5–15)
BUN: 9 mg/dL (ref 6–20)
CALCIUM: 8.7 mg/dL — AB (ref 8.9–10.3)
CO2: 28 mmol/L (ref 22–32)
Chloride: 105 mmol/L (ref 101–111)
Creatinine, Ser: 0.89 mg/dL (ref 0.44–1.00)
GFR calc Af Amer: >60 mL/min (ref >60)
GLUCOSE: 94 mg/dL (ref 65–99)
Potassium: 4.2 mmol/L (ref 3.5–5.1)
Sodium: 137 mmol/L (ref 135–145)

## 2016-03-29 LAB — TROPONIN I

## 2016-03-29 MED ORDER — GI COCKTAIL ~~LOC~~
30.0000 mL | Freq: Once | ORAL | Status: AC
  Administered 2016-03-29: 30 mL via ORAL
  Filled 2016-03-29: qty 30

## 2016-03-29 MED ORDER — FAMOTIDINE 20 MG PO TABS: 20.0000 mg | ORAL_TABLET | Freq: Two times a day (BID) | ORAL | 0 refills | Status: DC

## 2016-03-29 NOTE — ED Notes (Signed)
Patient transported to X-ray 

## 2016-03-29 NOTE — ED Provider Notes (Signed)
[facility] Provider Note   CSN: [id number] Arrival date & time: 03/29/16  0911     History   Chief Complaint Chief Complaint  Patient presents with  . Chest Pain    HPI Brooke Silva is a 31 y.o. female.  HPI   Patient presents with central sharp chest pain, sensation of something stuck in her throat, feels like indigestion.  Pain has been constant but improving since yesterday.  Has been lightheaded and feeling SOB.  Pain is not exacerbated by eating, is not pleuritic or exertional.  She is currently feeling hungry.   Denies fevers, cough, abdominal pain, recent immobilization, leg swelling, exogenous estrogen, family or personal hx blood clot.    History reviewed. No pertinent past medical history.  Patient Active Problem List   Diagnosis Date Noted  . Bilateral knee pain 02/03/2016  . Right shoulder injury 05/18/2015  . Right wrist injury 05/18/2015  . Low back pain 05/18/2015    History reviewed. No pertinent surgical history.  OB History    No data available       Home Medications    Prior to Admission medications   Medication Sig Start Date End Date Taking? Authorizing Provider  famotidine (PEPCID) 20 MG tablet Take 1 tablet (20 mg total) by mouth 2 (two) times daily. 03/29/16   Trixie DredgeEmily Wayne Wicklund, PA-C  ibuprofen (ADVIL,MOTRIN) 800 MG tablet Take 800 mg by mouth every 8 (eight) hours as needed.    Historical Provider, MD  meloxicam (MOBIC) 7.5 MG tablet Take 7.5 mg by mouth daily.    Historical Provider, MD  predniSONE (STERAPRED UNI-PAK 21 TAB) 10 MG (21) TBPK tablet Take 10 mg by mouth daily.    Historical Provider, MD    Family History No family history on file.  Social History Social History  Substance Use Topics  . Smoking status: Never Smoker  . Smokeless tobacco: Never Used  . Alcohol use No     Allergies   Patient has no known allergies.   Review of Systems Review of Systems  All other systems reviewed and are  negative.    Physical Exam Updated Vital Signs BP 119/69 (BP Location: Right Arm)   Pulse 72   Temp 98.2 F (36.8 C) (Oral)   Resp 16   Wt 89.8 kg   LMP 03/08/2016   SpO2 100%   BMI 31.96 kg/m   Physical Exam  Constitutional: She appears well-developed and well-nourished. No distress.  HENT:  Head: Normocephalic and atraumatic.  Neck: Neck supple.  Cardiovascular: Normal rate and regular rhythm.   Pulmonary/Chest: Effort normal and breath sounds normal. No respiratory distress. She has no wheezes. She has no rales.  Abdominal: Soft. She exhibits no distension. There is no tenderness. There is no rebound and no guarding.  Neurological: She is alert.  Skin: She is not diaphoretic.  Nursing note and vitals reviewed.    ED Treatments / Results  Labs (all labs ordered are listed, but only abnormal results are displayed) Labs Reviewed  BASIC METABOLIC PANEL - Abnormal; Notable for the following:       Result Value   Calcium 8.7 (*)    Anion gap 4 (*)    All other components within normal limits  CBC  TROPONIN I    EKG  EKG Interpretation  Date/Time:  Tuesday March 29 2016 09:33:19 EST Ventricular Rate:  67 PR Interval:  156 QRS Duration: 80 QT Interval:  382 QTC Calculation: 403 R Axis:   43  Text Interpretation:  Poor data quality, interpretation may be adversely affected Normal sinus rhythm Normal ECG When compared to prior, no significant chagnes seen. Significant artifact  No STEMI Confirmed by Rush Landmark MD, CHRISTOPHER [phone]) on 03/29/2016 1:00:18 PM       Radiology Dg Chest 2 View  Result Date: 03/29/2016 CLINICAL DATA:  Chest pain, shortness of breath starting yesterday morning EXAM: CHEST  2 VIEW COMPARISON:  03/09/2015 FINDINGS: Cardiomediastinal silhouette is stable. No infiltrate or pleural effusion. No pulmonary edema. Bony thorax is unremarkable. IMPRESSION: No active cardiopulmonary disease. Electronically Signed   By: Natasha Mead M.D.   On:  03/29/2016 10:17    Procedures Procedures (including critical care time)  Medications Ordered in ED Medications  gi cocktail (Maalox,Lidocaine,Donnatal) (30 mLs Oral Given 03/29/16 1016)     Initial Impression / Assessment and Plan / ED Course  I have reviewed the triage vital signs and the nursing notes.  Pertinent labs & imaging results that were available during my care of the patient were reviewed by me and considered in my medical decision making (see chart for details).  Clinical Course as of Mar 29 1653  Tue Mar 29, 2016  1056 Pt reports complete but temporary relief with GI cocktail.    [EW]    Clinical Course User Index [EW] Trixie Dredge, PA-C    Afebrile, nontoxic patient with atypical chest pain that feels like indigestion and is relieved by GI cocktail.  Workup otherwise unremarkable.  D/C home with famotidine, PCP follow up.  Discussed result, findings, treatment, and follow up  with patient.  Pt given return precautions.  Pt verbalizes understanding and agrees with plan.       I doubt any other EMC precluding discharge at this time including, but not necessarily limited to the following:  ACS, PE, aortic dissection, pneuothorax, pneumonia.    Final Clinical Impressions(s) / ED Diagnoses   Final diagnoses:  Gastroesophageal reflux disease, esophagitis presence not specified    New Prescriptions Discharge Medication List as of 03/29/2016 10:57 AM    START taking these medications   Details  famotidine (PEPCID) 20 MG tablet Take 1 tablet (20 mg total) by mouth 2 (two) times daily., Starting Tue 03/29/2016, Print         [place], PA-C 03/29/16 1657    Canary Brim Tegeler, MD 03/29/16 2035

## 2016-03-29 NOTE — Discharge Instructions (Signed)
Read the information below.  Use the prescribed medication as directed.  Please discuss all new medications with your pharmacist.  You may return to the Emergency Department at any time for worsening condition or any new symptoms that concern you.   If you develop worsening chest pain, shortness of breath, fever, you pass out, or become weak or dizzy, return to the ER for a recheck.    °

## 2016-03-29 NOTE — ED Notes (Signed)
Told Pt anyone being seeing no eating and drinking the food they brought

## 2016-03-29 NOTE — Therapy (Signed)
[facility] [address]  [address] [place], [place], [address] Phone: [phone]   Fax:  [phone]  Physical Therapy Treatment  Patient Details  Name: Brooke Silva MRN: [id number] Date of Birth: 1985/10/25 Referring Provider: Dr. Norton Blizzard   Encounter Date: 03/29/2016      PT End of Session - 03/29/16 1608    Visit Number 9   Number of Visits 12   Date for PT Re-Evaluation 04/08/16   Authorization Type [facility]   Authorization Time Period termination date: 04-10-16   PT Start Time 1529   PT Stop Time 1625  moist heat to end treatment   PT Time Calculation (min) 56 min   Activity Tolerance Patient tolerated treatment well   Behavior During Therapy [facility] for tasks assessed/performed      No past medical history on file.  No past surgical history on file.  There were no vitals filed for this visit.      Subjective Assessment - 03/29/16 1532    Subjective Pt. reporting today was a painful day for the R shoulder however she feels the R shoulder pain is less frequent, less intense, and lasts a shorter duration since starting therapy.  Pt. reporting she is pain free some days however still has pain while lifting heavy trays at work.     Patient Stated Goals "stop being in pain"   Currently in Pain? Yes   Pain Score 7    Pain Location Shoulder   Pain Orientation Right;Posterior   Pain Descriptors / Indicators Sharp;Pressure   Pain Type Chronic pain   Pain Radiating Towards n/a today    Pain Onset More than a month ago   Pain Frequency Intermittent   Aggravating Factors  lifting, pulling    Pain Relieving Factors rice in sock,    Multiple Pain Sites No            [facility] Adult PT Treatment/Exercise - 03/29/16 1540      Self-Care   Self-Care Lifting   Lifting Proper lifting technique floor <> mat table ~ 40# box x 2 reps, mat table <> counter ~ 40# box x 2 reps; some cueing to improve neutral  spine however pt. with good overall technique with this      Lumbar Exercises: Standing   Heel Raises --     Shoulder Exercises: Prone   Extension AROM;10 reps;Both;Strengthening  prone I's on green p-ball; 3" hold    External Rotation AROM;10 reps;Both   External Rotation Limitations Prone W's on green p-ball (65cm)     Horizontal ABduction 1 AROM;10 reps;Both   Horizontal ABduction 1 Limitations Prone T's on green p-ball (65cm)     Shoulder Exercises: Standing   External Rotation Right;Both;Theraband;15 reps  with retraction; 3" hold    Theraband Level (Shoulder External Rotation) Level 2 (Red)   Extension Both;Theraband;10 reps   Theraband Level (Shoulder Extension) Level 4 (Blue)   Extension Limitations 5" hold    Row Both;10 reps;Theraband   Theraband Level (Shoulder Row) Level 4 (Blue)   Row Limitations 5" hold      Shoulder Exercises: Lawyer 30 seconds;3 reps   Research officer, political party Limitations at doorway low/mid/high   Other Shoulder Stretches Standing Rhomboids stretch on doorframe x 30 sec  pt. reporting good relief with this and performance at home     Modalities   Modalities Moist Heat     Moist Heat Therapy   Number  Minutes Moist Heat 10 Minutes   Moist Heat Location Shoulder  R           PT Short Term Goals - 03/09/16 1814      PT SHORT TERM GOAL #1   Title Independent with initial HEP by 03/11/16   Status Achieved           PT Long Term Goals - 03/08/16 1747      PT LONG TERM GOAL #1   Title Independent with advanced HEP as indicated by 04/08/16   Status On-going     PT LONG TERM GOAL #2   Title B shoulder, hip and knee strength >/= 4/5 for improved postural stability by 04/08/16   Status On-going     PT LONG TERM GOAL #3   Title Pt able to demonstrate proper posture and bodymechanics for lifting, pushing and pulling to improve safety with job performance by 04/08/16   Status On-going     PT LONG TERM GOAL #4   Title Pt will  report ability to complete her household chores and job tasks with >/= 50% reduction in pain by 04/08/16   Status On-going               Plan - 03/29/16 1605    Clinical Impression Statement Pt. reporting she has not felt LBP in over a week and R-sided upper back pain continues to be main complaint.  Pt. reporting she feels she is making progress with therapy with less frequent, intense, and shorter lasting pain while lifting at work.  Lifting form reviewed again with pt. today with ~40# box.  Pt. with good overall lifting technique however requiring min cues for neutral spine.  Pt. reporting some relief with rhomboids stretch however reports she is only performing stretches from HEP at this point.  Pt. instructed today to perform HEP daily for full benefit from therapy.  Pt. will continues to benefit from further skilled therapy to maximize function and reduce pain with job related tasks.     PT Treatment/Interventions Patient/family education;Neuromuscular re-education;Therapeutic exercise;Manual techniques;Taping;Dry needling;Therapeutic activities;Electrical Stimulation;Moist Heat;Iontophoresis 4mg /ml Dexamethasone;ADLs/Self Care Home Management   PT Next Visit Plan FOTO 10th visit; Continue with scapular stabilization strengthening; modalities & manual therapy PRN      Patient will benefit from skilled therapeutic intervention in order to improve the following deficits and impairments:  Pain, Impaired flexibility, Decreased strength, Decreased range of motion, Postural dysfunction, Improper body mechanics, Decreased activity tolerance, Increased muscle spasms  Visit Diagnosis: Chronic right shoulder pain  Pain in thoracic spine  Chronic midline low back pain with right-sided sciatica  Muscle weakness (generalized)     Problem List Patient Active Problem List   Diagnosis Date Noted  . Bilateral knee pain 02/03/2016  . Right shoulder injury 05/18/2015  . Right wrist injury  05/18/2015  . Low back pain 05/18/2015    Kermit BaloMicah Stiven Kaspar, PTA 03/29/16 5:45 PM   [facility] [address]  [address] [place], [place], [address] Phone: [phone]   Fax:  [phone]  Name: Brooke Silva MRN: [id number] Date of Birth: Feb 12, 1986

## 2016-03-29 NOTE — ED Triage Notes (Signed)
Chest pain since yesterday, describes it as pressure. Denies N/V. Also states she feels like something is stuck in her throat.

## 2016-03-31 ENCOUNTER — Ambulatory Visit: Payer: Self-pay

## 2016-03-31 DIAGNOSIS — G8929 Other chronic pain: Secondary | ICD-10-CM

## 2016-03-31 DIAGNOSIS — M546 Pain in thoracic spine: Secondary | ICD-10-CM

## 2016-03-31 DIAGNOSIS — M5441 Lumbago with sciatica, right side: Secondary | ICD-10-CM

## 2016-03-31 DIAGNOSIS — M6281 Muscle weakness (generalized): Secondary | ICD-10-CM

## 2016-03-31 DIAGNOSIS — M25511 Pain in right shoulder: Principal | ICD-10-CM

## 2016-03-31 NOTE — Therapy (Addendum)
[facility] [address]  [place], [place], [address] Phone: [phone]   Fax:  [phone]  Physical Therapy Treatment  Patient Details  Name: Brooke Silva MRN: [id number] Date of Birth: [birth year] Referring Provider: Dr. Karlton Lemon   Encounter Date: 03/31/2016      PT End of Session - 03/31/16 1004    Visit Number 10   Number of Visits 12   Date for PT Re-Evaluation 04/08/16   Authorization Type [facility] [employer]   Authorization Time Period termination date: 04-10-16   PT Start Time 0814  pt. arriving late    PT Stop Time 0845   PT Time Calculation (min) 31 min   Activity Tolerance Patient tolerated treatment well   Behavior During Therapy [facility] for tasks assessed/performed      No past medical history on file.  No past surgical history on file.  There were no vitals filed for this visit.      Subjective Assessment - 03/31/16 0822    Subjective Pt. reporting R posterior shoulder with worse pain this morning with no known trigger.     Patient Stated Goals "stop being in pain"   Currently in Pain? Yes   Pain Score 8    Pain Location Shoulder   Pain Orientation Right            [facility] PT Assessment - 03/31/16 1021      Observation/Other Assessments   Focus on Therapeutic Outcomes (FOTO)  59% status (41% limitation)            [facility] Adult PT Treatment/Exercise - 03/31/16 0839      Shoulder Exercises: Supine   External Rotation AROM;Both;15 reps   Theraband Level (Shoulder External Rotation) Level 3 (Green)  laying on 1/2 foam bolster    Other Supine Exercises cross stretch over 1/2 foam bolster x 1 min    Other Supine Exercises cross stretch laying on 1/2 foam in external rotation position x 1 min      Shoulder Exercises: Standing   Extension Both;Theraband;12 reps   Theraband Level (Shoulder Extension) Level 4 (Blue)   Extension Limitations 3" hold    Other Standing Exercises  D1/D2 R shoulder diagonals with green TB x 10 reps each way; Pt. pain free with these; verbal/tactile cueing throughout for proper technique     Shoulder Exercises: Stretch   Corner Stretch 30 seconds;3 reps   Corner Stretch Limitations at doorway low/mid/high                PT Education - 03/31/16 0854    Education provided Yes   Education Details Supine over towel roll with horizontal abduction with green TB.(pt. already has this at home), sitting B ER with red TB issued to pt., Standing retraction/extension with green TB    Person(s) Educated Patient   Methods Explanation;Demonstration;Handout;Verbal cues   Comprehension Verbalized understanding;Returned demonstration;Verbal cues required;Need further instruction          PT Short Term Goals - 03/09/16 1814      PT SHORT TERM GOAL #1   Title Independent with initial HEP by 03/11/16   Status Achieved           PT Long Term Goals - 03/08/16 1747      PT LONG TERM GOAL #1   Title Independent with advanced HEP as indicated by 04/08/16   Status On-going     PT LONG TERM GOAL #2   Title B shoulder,  hip and knee strength >/= 4/5 for improved postural stability by 04/08/16   Status On-going     PT LONG TERM GOAL #3   Title Pt able to demonstrate proper posture and bodymechanics for lifting, pushing and pulling to improve safety with job performance by 04/08/16   Status On-going     PT LONG TERM GOAL #4   Title Pt will report ability to complete her household chores and job tasks with >/= 50% reduction in pain by 04/08/16   Status On-going               Plan - 03/31/16 0835    Clinical Impression Statement Pt. arriving late thus treatment shortened today. Pt. with high initial R posterior shoulder pain rating without known trigger however significant drop in this following scapular strengthening therex today.  Pt. still reporting R shoulder pain as chief complaint and that mid/low back have been pain free for  two weeks.  Diagonal patterns with green TB initiated with pt. tolerating these pain free.  Today's treatment focusing on updating HEP with more scapular strengthening activities added.  Pt. R posterior shoulder pain falling to 3/10 pain level to end treatment and seems to respond well to scapular strengthening  Pt. still reporting limited adherence to HEP however instructed to perform updated HEP before next treatment.  Will plan to monitor response to updated HEP in coming visits.   PT Treatment/Interventions Patient/family education;Neuromuscular re-education;Therapeutic exercise;Manual techniques;Taping;Dry needling;Therapeutic activities;Electrical Stimulation;Moist Heat;Iontophoresis 66m/ml Dexamethasone;ADLs/Self Care Home Management   PT Next Visit Plan Review updated HEP; Continue with scapular stabilization strengthening; modalities & manual therapy PRN      Patient will benefit from skilled therapeutic intervention in order to improve the following deficits and impairments:  Pain, Impaired flexibility, Decreased strength, Decreased range of motion, Postural dysfunction, Improper body mechanics, Decreased activity tolerance, Increased muscle spasms  Visit Diagnosis: Chronic right shoulder pain  Pain in thoracic spine  Chronic midline low back pain with right-sided sciatica  Muscle weakness (generalized)     Problem List Patient Active Problem List   Diagnosis Date Noted  . Bilateral knee pain 02/03/2016  . Right shoulder injury 05/18/2015  . Right wrist injury 05/18/2015  . Low back pain 05/18/2015    MBess Harvest PTA 03/31/16 10:24 AM   [facility] [address] [place] [address]Phone: [phone]  Fax:  [phone] Name: Brooke RahlMRN: [id number] of Birth: [birth year]  PHYSICAL THERAPY DISCHARGE SUMMARY  Visits from Start of Care: 10  Current functional level related to  goals / functional outcomes:   Unable to formally assess as pt has cancelled or no showed for 5 appts this week. Per [facility] policy, pt will be discharged for 3 no shows.   Remaining deficits:   As above.   Education / Equipment:    HEP, posture/body mechanics education Plan: Patient agrees to discharge.  Patient goals were partially met. Patient is being discharged due to not returning since the last visit.  ?????    JPercival Spanish PT, MPT 04/08/16, 9:54 AM  [facility] [place] [address]Phone: [phone]  Fax:  [phone]

## 2016-04-04 ENCOUNTER — Ambulatory Visit: Payer: Self-pay | Admitting: Physical Therapy

## 2016-04-05 ENCOUNTER — Ambulatory Visit: Payer: Self-pay | Admitting: Physical Therapy

## 2016-04-06 ENCOUNTER — Ambulatory Visit: Payer: Self-pay | Admitting: Physical Therapy

## 2016-04-07 ENCOUNTER — Encounter (INDEPENDENT_AMBULATORY_CARE_PROVIDER_SITE_OTHER): Payer: Self-pay

## 2016-04-07 ENCOUNTER — Ambulatory Visit (INDEPENDENT_AMBULATORY_CARE_PROVIDER_SITE_OTHER): Payer: Self-pay | Admitting: Diagnostic Neuroimaging

## 2016-04-07 ENCOUNTER — Ambulatory Visit: Payer: Self-pay | Admitting: Physical Therapy

## 2016-04-07 DIAGNOSIS — Z0289 Encounter for other administrative examinations: Secondary | ICD-10-CM

## 2016-04-07 DIAGNOSIS — M79601 Pain in right arm: Secondary | ICD-10-CM

## 2016-04-08 ENCOUNTER — Ambulatory Visit: Payer: Self-pay | Admitting: Physical Therapy

## 2016-04-08 NOTE — Procedures (Signed)
[facility]  NCS (NERVE CONDUCTION STUDY) WITH EMG (ELECTROMYOGRAPHY) REPORT   STUDY DATE: 04/07/16 PATIENT NAME: Charleston RopesKimyatta Rollyson DOB: 27-Feb-1985 MRN: [id number]  ORDERING CLINICIAN: Norton BlizzardShane Hudnall, MD  TECHNOLOGIST: Charlesetta IvoryBeau Handy  ELECTROMYOGRAPHER: Glenford BayleyVikram R. Penumalli, MD  CLINICAL INFORMATION: 31 year old female with right arm / shoulder pain.  FINDINGS: NERVE CONDUCTION STUDY: Bilateral median and right ulnar motor responses are normal. Right ulnar F wave latency is normal.  Bilateral median and right ulnar sensory responses are normal. Bilateral median to ulnar transcarpal comparison studies are normal.   NEEDLE ELECTROMYOGRAPHY: Needle examination of right upper extremity deltoid, biceps, triceps, flexor carpi radialis, first dorsal interosseous, trapezius, right cervical C5-6 paraspinal muscles, is normal.   IMPRESSION:  This is a normal study. No electrodiagnostic evidence of large fiber neuropathy or cervical radiculopathy at this time.     INTERPRETING PHYSICIAN:  Suanne MarkerVIKRAM R. PENUMALLI, MD Certified in Neurology, Neurophysiology and Neuroimaging  [facility] [address], [address] [place], [place] [address] [phone]  [facility]    Nerve / Sites Rec. Site Peak Lat Ref. Amp.1-2 Ref. Distance    ms ms V V cm  R Median, Ulnar - Transcarpal comparison     Median Palm Wrist 1.82 ?2.20 57.2 ?40.0 8     Ulnar Palm Wrist 1.82 ?2.20 12.6 ?12.0 8          L Median, Ulnar - Transcarpal comparison     Median Palm Wrist 1.88 ?2.20 68.3 ?40.0 8     Ulnar Palm Wrist 1.88 ?2.20 14.6 ?12.0 8          R Median - Orthodromic (Dig II, Mid palm)     Dig II Wrist 3.13 ?3.40 12.3 ?10.0 13  L Median - Orthodromic (Dig II, Mid palm)     Dig II Wrist 2.60 ?3.40 22.9 ?10.0 13  R Ulnar - Orthodromic, (Dig V, Mid palm)     Dig V Wrist 2.29 ?3.10 12.6 ?5.0 11     [facility]    Nerve / Sites Muscle Latency Ref. Amplitude Ref. Rel Amp Segments  Distance Lat Diff Velocity Ref. Area    ms ms mV mV %  cm ms m/s m/s mVms  R Median - APB     Wrist APB 3.2 ?4.4 4.5 ?4.0 100 Wrist - APB 7    8.0     Upper arm APB 6.9  4.0  87.8 Upper arm - Wrist 22 3.8 59  6.6  L Median - APB     Wrist APB 2.7 ?4.4 7.2 ?4.0 100 Wrist - APB 7    23.6     Upper arm APB 6.6  6.5  90.4 Upper arm - Wrist 22 3.9 56  22.0  R Ulnar - ADM     Wrist ADM 2.3 ?3.3 14.0 ?6.0 100 Wrist - ADM 7    29.2     B.Elbow ADM 5.2  13.5  96.4 B.Elbow - Wrist 16 2.9 56 ?49 29.6     A.Elbow ADM 7.3  12.5  93.2 A.Elbow - B.Elbow 12 2.1 56 ?49 27.8         A.Elbow - Wrist  5.0        F  Wave    Nerve F Lat Ref.   ms ms  R Ulnar - ADM 25.6 ?32.0     EMG full       EMG Summary Table    Spontaneous MUAP Recruitment  Muscle IA Fib PSW Fasc  Other Amp Dur. Poly Pattern  R. Deltoid Normal None None None _______ Normal Normal Normal Normal  R. Biceps brachii Normal None None None _______ Normal Normal Normal Normal  R. Triceps brachii Normal None None None _______ Normal Normal Normal Normal  R. Flexor carpi radialis Normal None None None _______ Normal Normal Normal Normal  R. First dorsal interosseous Normal None None None _______ Normal Normal Normal Normal  R. Cervical paraspinals Normal None None None _______ Normal Normal Normal Normal  R. Trapezius Normal None None None _______ Normal Normal Normal Normal

## 2016-05-03 IMAGING — CR DG CHEST 2V
2 series · 2 of 2 positions shown · non-contrast
Comparison: 01/08/2015

CLINICAL DATA: New cough for 2 weeks, shortness of breath,
intermittent mid chest pain worse today

EXAM:
CHEST  2 VIEW

[w chest pa]
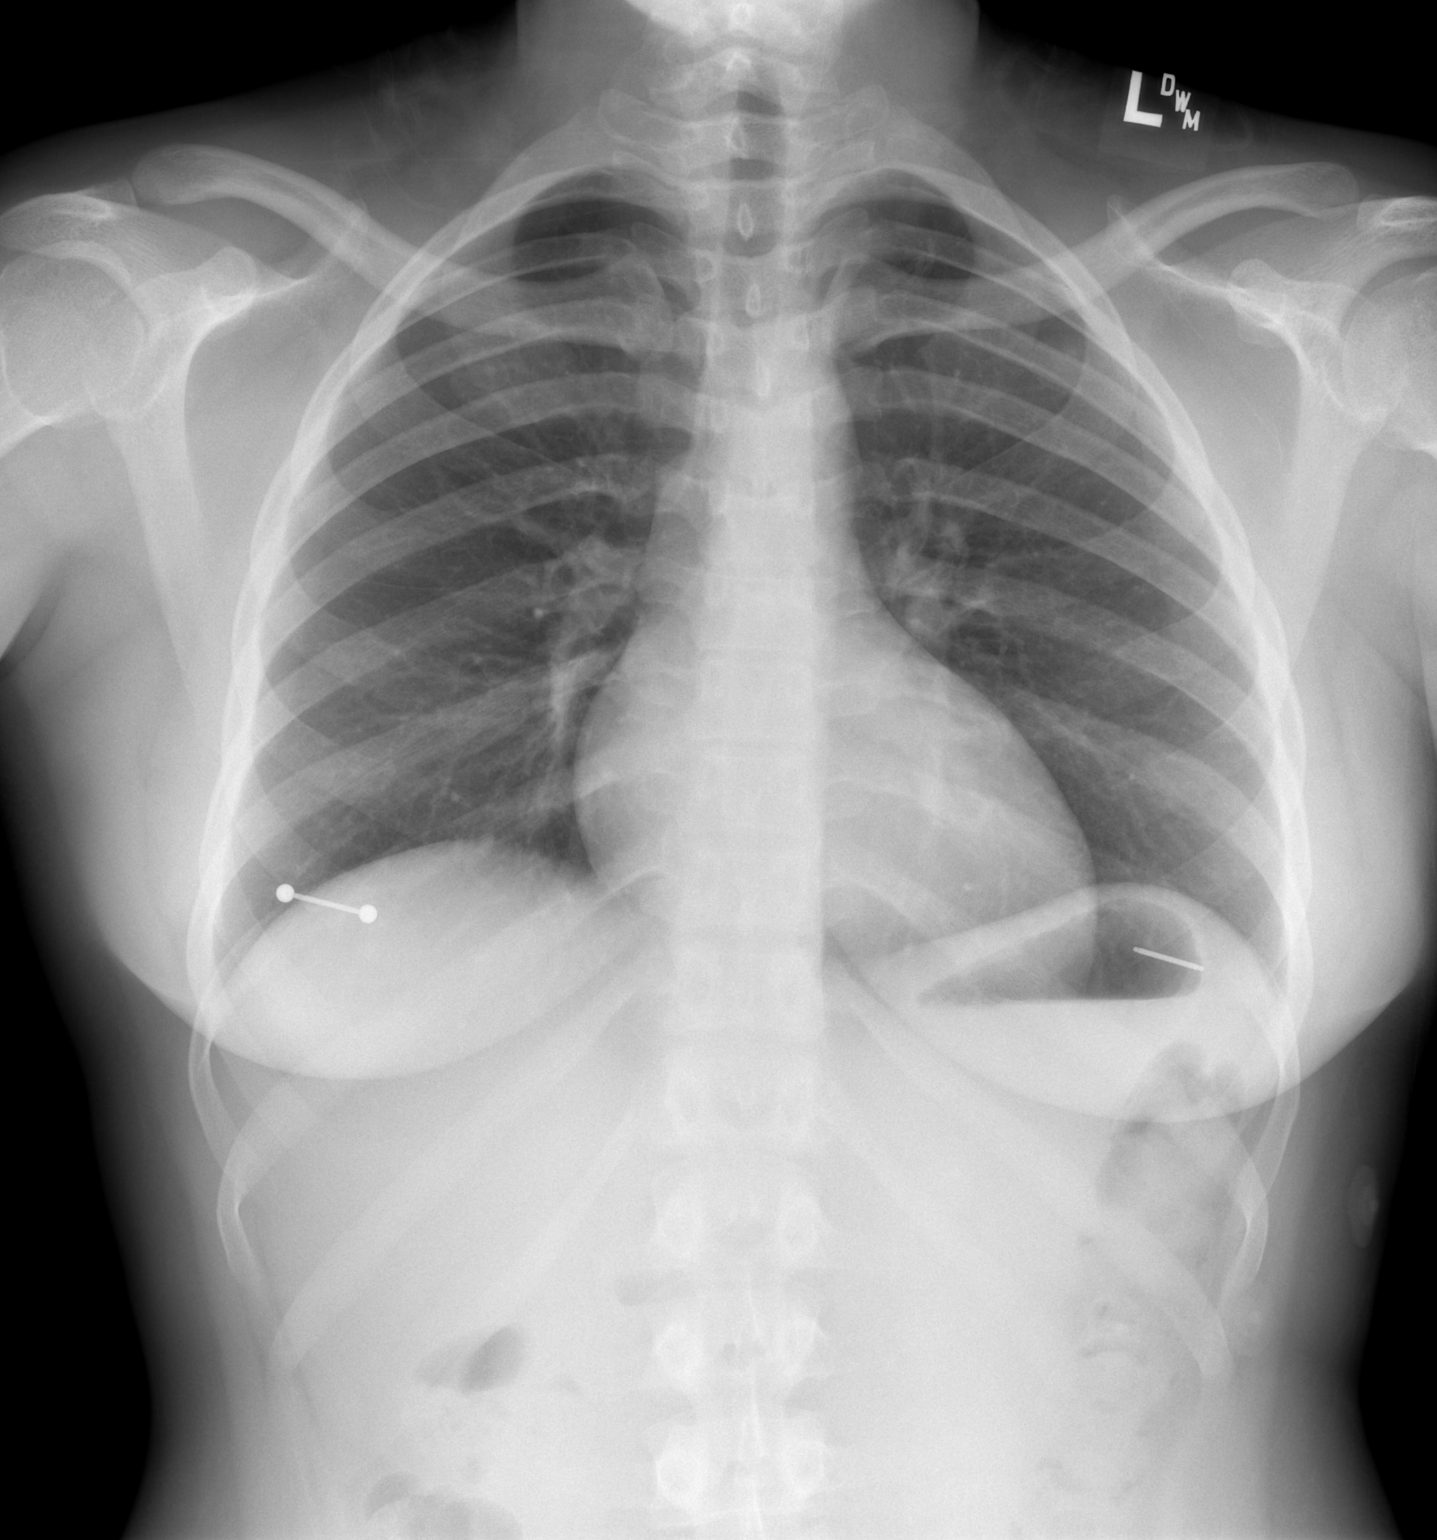

[w chest lat]
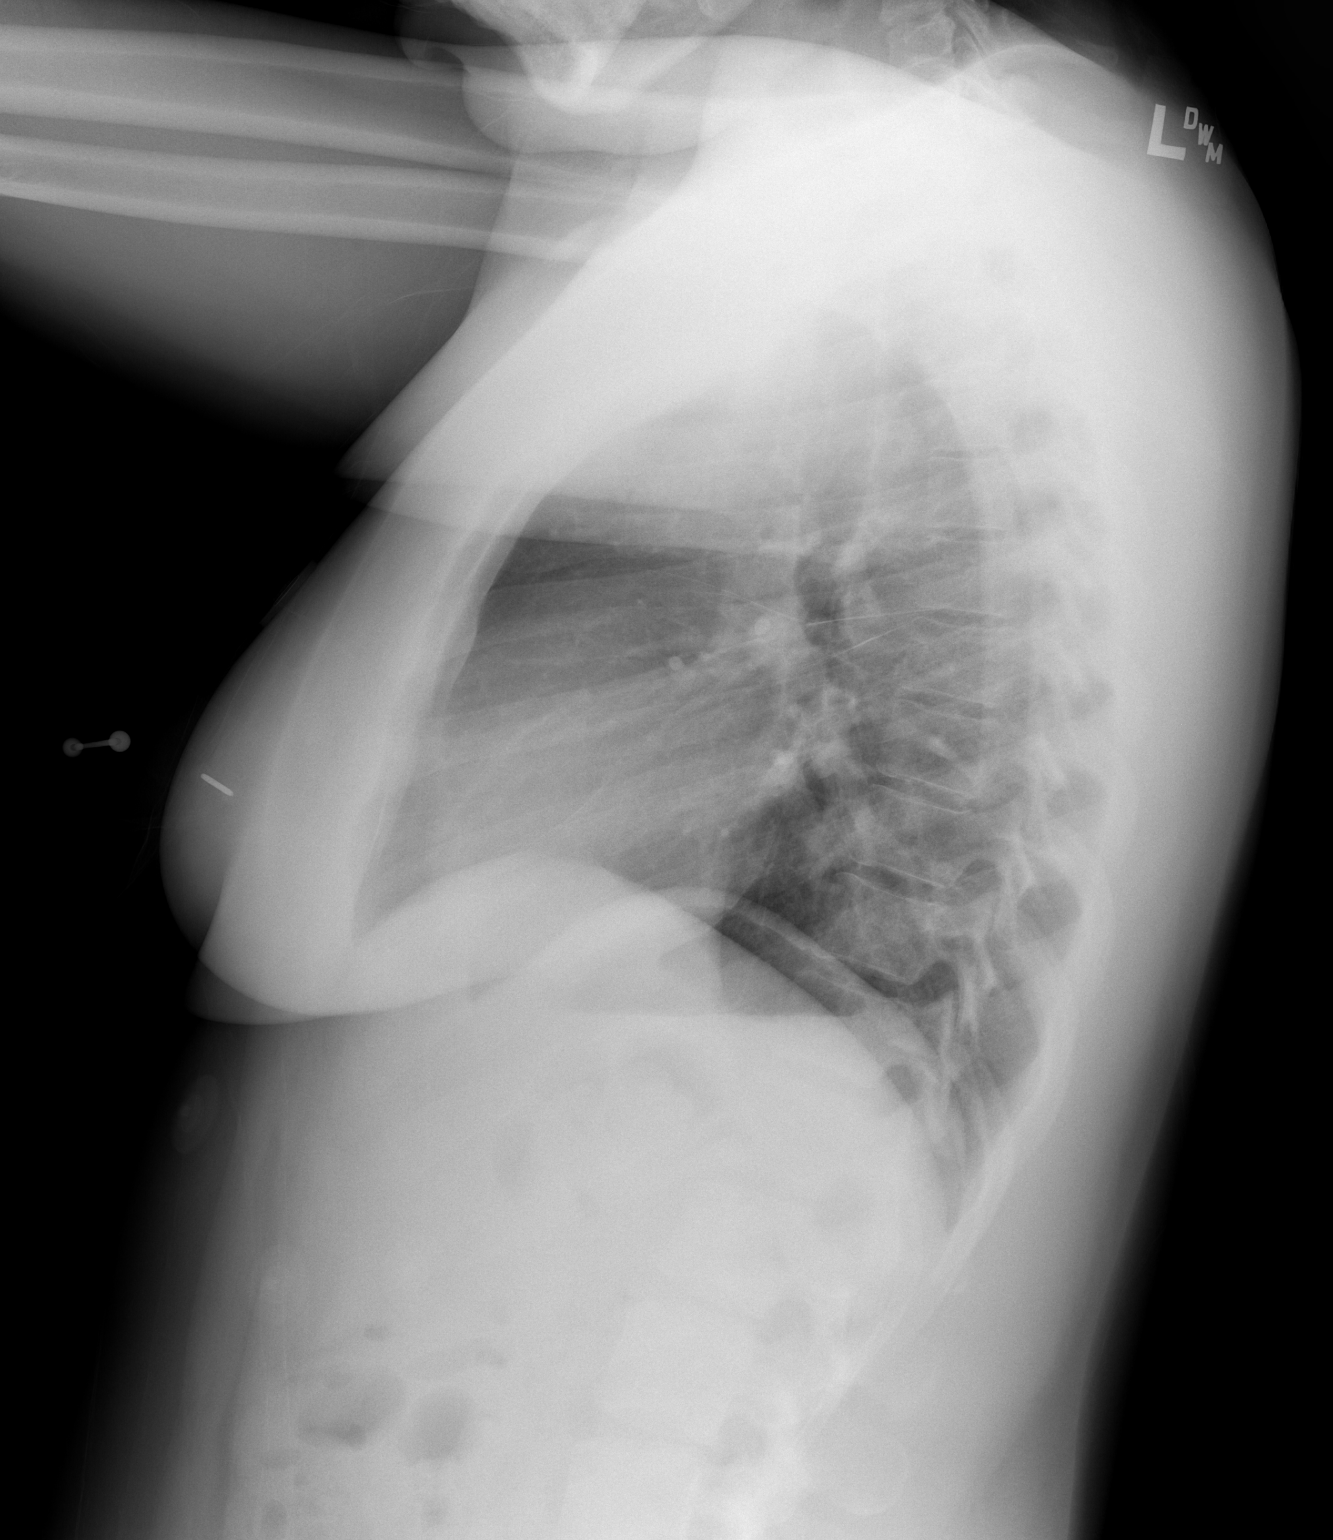

[2 of 2 positions shown; findings below may reference images not displayed]

FINDINGS: Normal heart size, mediastinal contours, and pulmonary vascularity.

Lungs clear.

No pneumothorax.

Bones unremarkable.
IMPRESSION: Normal exam.

## 2016-06-24 ENCOUNTER — Telehealth (HOSPITAL_COMMUNITY): Payer: Self-pay | Admitting: *Deleted

## 2016-06-24 NOTE — Telephone Encounter (Signed)
[facility] Patient stated needed LEEP procedure done. Advised patient would not be eligible for [facility] since already had colposcopy. Patient will need to call [facility]. Patient voiced understanding.

## 2016-08-04 ENCOUNTER — Telehealth: Payer: Self-pay | Admitting: Family Medicine

## 2016-08-04 NOTE — Telephone Encounter (Signed)
Spoke to patient and told her about the x-rays and ultrasound of the shoulder being normal. She stated that she does have a PCP now. Suggested that she contact her PCP for other recommendations.

## 2016-08-04 NOTE — Telephone Encounter (Signed)
She wanted to discuss finding from all her previous tests to see what is wrong with her R. shoulder.  She cannot lift her arm and just wanted to speak to you on phone about the previous results before she schedules an appt with you.   She had another x-ray of her spine and they told it her she has degenerative disc disease.  She needs to know what to do next (after she speaks to you).

## 2016-08-04 NOTE — Telephone Encounter (Signed)
Let her know she has x-rays and musculoskeletal ultrasound of her shoulder - both were normal.  There's nothing from an orthopedic standpoint otherwise to explain her persistent pain if she's not responding to PT.  I think it would be in her best interest to establish care with a primary care physician, consider seeing rheumatology (it looks like a PM&R physician recommended this in January as well) to be evaluated.

## 2016-10-05 ENCOUNTER — Emergency Department (HOSPITAL_BASED_OUTPATIENT_CLINIC_OR_DEPARTMENT_OTHER)
Admission: EM | Admit: 2016-10-05 | Discharge: 2016-10-05 | Disposition: A | Discharge status: Home / Self Care | Payer: Self-pay | Attending: Emergency Medicine | Admitting: Emergency Medicine

## 2016-10-05 ENCOUNTER — Emergency Department (HOSPITAL_BASED_OUTPATIENT_CLINIC_OR_DEPARTMENT_OTHER): Payer: Self-pay

## 2016-10-05 ENCOUNTER — Encounter (HOSPITAL_BASED_OUTPATIENT_CLINIC_OR_DEPARTMENT_OTHER): Payer: Self-pay | Admitting: Emergency Medicine

## 2016-10-05 DIAGNOSIS — Z79899 Other long term (current) drug therapy: Secondary | ICD-10-CM | POA: Insufficient documentation

## 2016-10-05 DIAGNOSIS — R1013 Epigastric pain: Secondary | ICD-10-CM | POA: Insufficient documentation

## 2016-10-05 HISTORY — DX: Dorsalgia, unspecified: M54.9

## 2016-10-05 LAB — CBC WITH DIFFERENTIAL/PLATELET
BASOS ABS: 0 10*3/uL (ref 0.0–0.1)
BASOS PCT: 1 %
Eosinophils Absolute: 0.2 10*3/uL (ref 0.0–0.7)
Eosinophils Relative: 2 %
HEMATOCRIT: 35.4 % — AB (ref 36.0–46.0)
HEMOGLOBIN: 11.4 g/dL — AB (ref 12.0–15.0)
LYMPHS PCT: 43 %
Lymphs Abs: 3.4 10*3/uL (ref 0.7–4.0)
MCH: 26.3 pg (ref 26.0–34.0)
MCHC: 32.2 g/dL (ref 30.0–36.0)
MCV: 81.8 fL (ref 78.0–100.0)
MONOS PCT: 7 %
Monocytes Absolute: 0.5 10*3/uL (ref 0.1–1.0)
NEUTROS ABS: 3.7 10*3/uL (ref 1.7–7.7)
NEUTROS PCT: 47 %
Platelets: 329 10*3/uL (ref 150–400)
RBC: 4.33 MIL/uL (ref 3.87–5.11)
RDW: 12.9 % (ref 11.5–15.5)
WBC: 7.8 10*3/uL (ref 4.0–10.5)

## 2016-10-05 LAB — COMPREHENSIVE METABOLIC PANEL
ALBUMIN: 3.4 g/dL — AB (ref 3.5–5.0)
ALK PHOS: 49 U/L (ref 38–126)
ALT: 20 U/L (ref 14–54)
AST: 19 U/L (ref 15–41)
Anion gap: 6 (ref 5–15)
BILIRUBIN TOTAL: 0.6 mg/dL (ref 0.3–1.2)
BUN: 12 mg/dL (ref 6–20)
CALCIUM: 8.6 mg/dL — AB (ref 8.9–10.3)
CO2: 27 mmol/L (ref 22–32)
Chloride: 105 mmol/L (ref 101–111)
Creatinine, Ser: 0.83 mg/dL (ref 0.44–1.00)
GFR calc Af Amer: >60 mL/min (ref >60)
Glucose, Bld: 93 mg/dL (ref 65–99)
Potassium: 3.6 mmol/L (ref 3.5–5.1)
Sodium: 138 mmol/L (ref 135–145)
TOTAL PROTEIN: 6.3 g/dL — AB (ref 6.5–8.1)

## 2016-10-05 LAB — LIPASE, BLOOD: LIPASE: 27 U/L (ref 11–51)

## 2016-10-05 MED ORDER — GI COCKTAIL ~~LOC~~
30.0000 mL | Freq: Once | ORAL | Status: AC
  Administered 2016-10-05: 30 mL via ORAL
  Filled 2016-10-05: qty 30

## 2016-10-05 MED ORDER — RANITIDINE HCL 150 MG PO TABS
ORAL_TABLET | ORAL | Status: AC
  Administered 2016-10-05: 150 mg
  Filled 2016-10-05: qty 1

## 2016-10-05 NOTE — ED Triage Notes (Signed)
Onset x 4 days of epigastric pain and vomiting after eating

## 2016-10-05 NOTE — ED Provider Notes (Signed)
[facility] Provider Note   CSN: [id number] Arrival date & time: 10/05/16  7829     History   Chief Complaint Chief Complaint  Patient presents with  . Chest Pain    HPI Natika Geyer is a 31 y.o. female.  31 yo F with a chief complaint of epigastric abdominal pain. Worse with eating. Going on for the past couple weeks. She was seen in the ED previously for the same. The started on Pepcid. She is not taking this medication. She thinks her symptoms related to her prednisone use. She is on prednisone for sciatica. She has been taking mobic for many months. Has also been constipated for the last 4 days or so. Normally takes a laxative and took 1 without improvement. Had one episode of vomiting. Denies fevers or chills. Denies prior abdominal surgery.   The history is provided by the patient.  Chest Pain   This is a new problem. The current episode started more than 2 days ago. The problem occurs constantly. The problem has not changed since onset.The pain is associated with eating. The pain is present in the epigastric region. The pain is at a severity of 8/10. The quality of the pain is described as burning. The pain does not radiate. Duration of episode(s) is 2 days. Associated symptoms include abdominal pain. Pertinent negatives include no dizziness, no fever, no headaches, no nausea, no palpitations, no shortness of breath and no vomiting. She has tried nothing for the symptoms. The treatment provided no relief.    Past Medical History:  Diagnosis Date  . Back pain     Patient Active Problem List   Diagnosis Date Noted  . Bilateral knee pain 02/03/2016  . Right shoulder injury 05/18/2015  . Right wrist injury 05/18/2015  . Low back pain 05/18/2015    History reviewed. No pertinent surgical history.  OB History    No data available       Home Medications    Prior to Admission medications   Medication Sig Start Date End Date Taking? Authorizing  Provider  albuterol (PROVENTIL HFA;VENTOLIN HFA) 108 (90 Base) MCG/ACT inhaler Inhale into the lungs every 6 (six) hours as needed for wheezing or shortness of breath.   Yes [provider]  medroxyPROGESTERone (DEPO-PROVERA) 150 MG/ML injection Inject 150 mg into the muscle every 3 (three) months.   Yes [provider]  phentermine 37.5 MG capsule Take 37.5 mg by mouth every morning.   Yes [provider]  meloxicam (MOBIC) 7.5 MG tablet Take 7.5 mg by mouth daily.    [provider]  predniSONE (STERAPRED UNI-PAK 21 TAB) 10 MG (21) TBPK tablet Take 10 mg by mouth daily.    [provider]    Family History No family history on file.  Social History Social History  Substance Use Topics  . Smoking status: Never Smoker  . Smokeless tobacco: Never Used  . Alcohol use No     Allergies   Patient has no known allergies.   Review of Systems Review of Systems  Constitutional: Negative for chills and fever.  HENT: Negative for congestion and rhinorrhea.   Eyes: Negative for redness and visual disturbance.  Respiratory: Negative for shortness of breath and wheezing.   Cardiovascular: Negative for chest pain and palpitations.  Gastrointestinal: Positive for abdominal pain and constipation. Negative for nausea and vomiting.  Genitourinary: Negative for dysuria and urgency.  Musculoskeletal: Negative for arthralgias and myalgias.  Skin: Negative for pallor and wound.  Neurological: Negative for dizziness and headaches.     Physical Exam Updated Vital Signs Ht 5\' 7"  (1.702 m)   Wt 86.2 kg (190 lb)   BMI 29.76 kg/m   Physical Exam  Constitutional: She is oriented to person, place, and time. She appears well-developed and well-nourished. No distress.  HENT:  Head: Normocephalic and atraumatic.  Eyes: Pupils are equal, round, and reactive to light. EOM are normal.  Neck: Normal range of motion. Neck supple.  Cardiovascular: Normal rate  and regular rhythm.  Exam reveals no gallop and no friction rub.   No murmur heard. Pulmonary/Chest: Effort normal. She has no wheezes. She has no rales.  Abdominal: Soft. She exhibits no distension and no mass. There is tenderness (mild epigastric). There is no guarding.  Musculoskeletal: She exhibits no edema or tenderness.  Neurological: She is alert and oriented to person, place, and time.  Skin: Skin is warm and dry. She is not diaphoretic.  Psychiatric: She has a normal mood and affect. Her behavior is normal.  Nursing note and vitals reviewed.    ED Treatments / Results  Labs (all labs ordered are listed, but only abnormal results are displayed) Labs Reviewed  CBC WITH DIFFERENTIAL/PLATELET - Abnormal; Notable for the following:       Result Value   Hemoglobin 11.4 (*)    HCT 35.4 (*)    All other components within normal limits  COMPREHENSIVE METABOLIC PANEL - Abnormal; Notable for the following:    Calcium 8.6 (*)    Total Protein 6.3 (*)    Albumin 3.4 (*)    All other components within normal limits  LIPASE, BLOOD    EKG  EKG Interpretation  Date/Time:  Wednesday October 05 2016 09:44:31 EDT Ventricular Rate:  74 PR Interval:    QRS Duration: 77 QT Interval:  383 QTC Calculation: 425 R Axis:   53 Text Interpretation:  Sinus rhythm ST elev, probable normal early repol pattern No significant change since last tracing Confirmed by Melene Plan [id number]) on 10/05/2016 9:49:59 AM       Radiology Dg Chest 2 View  Result Date: 10/05/2016 CLINICAL DATA:  Sudden onset epigastric pain. Vomiting after eating for 4 days. No other complaints. EXAM: CHEST  2 VIEW COMPARISON:  03/29/2016 FINDINGS: The heart size and mediastinal contours are within normal limits. Both lungs are clear. The visualized skeletal structures are unremarkable. IMPRESSION: No active cardiopulmonary disease. Electronically Signed   By: Elige Ko   On: 10/05/2016 10:17    Procedures Procedures  (including critical care time)  Medications Ordered in ED Medications  gi cocktail (Maalox,Lidocaine,Donnatal) (30 mLs Oral Given 10/05/16 1052)     Initial Impression / Assessment and Plan / ED Course  I have reviewed the triage vital signs and the nursing notes.  Pertinent labs & imaging results that were available during my care of the patient were reviewed by me and considered in my medical decision making (see chart for details).     31 yo F With a chief complaint of epigastric abdominal pain that is worse after eating. Was likely this is gastritis after having prolonged NSAID use. Will obtain labs to evaluate for hepatitis or pancreatitis. She also is having some crampy lower abdominal pain that sounds mostly like constipation. She had a benign abdominal exam. Do not feel that imaging is warranted at this time.  Her initial triage complaint was chest pain and she obtained an EKG and a chest x-ray that are unremarkable.  Labs unremarkable.  D/c home.   11:45 AM:  I have discussed the diagnosis/risks/treatment options with the patient and believe the pt to be eligible for discharge home to follow-up with PCP, GI. We also discussed returning to the ED immediately if new or worsening sx occur. We discussed the sx which are most concerning (e.g., sudden worsening pain, fever, inability to tolerate by mouth) that necessitate immediate return. Medications administered to the patient during their visit and any new prescriptions provided to the patient are listed below.  Medications given during this visit Medications  gi cocktail (Maalox,Lidocaine,Donnatal) (30 mLs Oral Given 10/05/16 1052)     The patient appears reasonably screen and/or stabilized for discharge and I doubt any other medical condition or other [facility] requiring further screening, evaluation, or treatment in the ED at this time prior to discharge.    Final Clinical Impressions(s) / ED Diagnoses   Final diagnoses:    Epigastric abdominal pain    New Prescriptions New Prescriptions   No medications on file     Melene Plan, DO 10/05/16 1145

## 2016-10-05 NOTE — Discharge Instructions (Signed)
Try zantac 150mg  twice a day.    Try to avoid alcohol, tobacco, spicy foods, tomato-based products, fatty foods, chocolate, and peppermint. These are known to make reflux worse. Also do not eat or drink anything within 3 hours of going to bed.  Meloxicam is known to cause GI side effects. Ibuprofen is thought to have less side effects. You can switch to:  Take 4 over the counter ibuprofen tablets 3 times a day or 2 over-the-counter naproxen tablets twice a day for pain. Also take tylenol 1000mg (2 extra strength) four times a day.

## 2017-06-28 ENCOUNTER — Ambulatory Visit: Payer: Self-pay | Admitting: Allergy and Immunology

## 2017-07-03 ENCOUNTER — Other Ambulatory Visit: Payer: Self-pay

## 2017-07-03 ENCOUNTER — Emergency Department (HOSPITAL_BASED_OUTPATIENT_CLINIC_OR_DEPARTMENT_OTHER)
Admission: EM | Admit: 2017-07-03 | Discharge: 2017-07-03 | Disposition: A | Discharge status: Home / Self Care | Payer: No Typology Code available for payment source | Attending: Emergency Medicine | Admitting: Emergency Medicine

## 2017-07-03 ENCOUNTER — Emergency Department (HOSPITAL_BASED_OUTPATIENT_CLINIC_OR_DEPARTMENT_OTHER): Payer: No Typology Code available for payment source

## 2017-07-03 ENCOUNTER — Encounter (HOSPITAL_BASED_OUTPATIENT_CLINIC_OR_DEPARTMENT_OTHER): Payer: Self-pay | Admitting: Emergency Medicine

## 2017-07-03 DIAGNOSIS — Z79899 Other long term (current) drug therapy: Secondary | ICD-10-CM | POA: Diagnosis not present

## 2017-07-03 DIAGNOSIS — Y92410 Unspecified street and highway as the place of occurrence of the external cause: Secondary | ICD-10-CM | POA: Diagnosis not present

## 2017-07-03 DIAGNOSIS — Y9389 Activity, other specified: Secondary | ICD-10-CM | POA: Diagnosis not present

## 2017-07-03 DIAGNOSIS — Y999 Unspecified external cause status: Secondary | ICD-10-CM | POA: Insufficient documentation

## 2017-07-03 DIAGNOSIS — S161XXA Strain of muscle, fascia and tendon at neck level, initial encounter: Secondary | ICD-10-CM | POA: Diagnosis not present

## 2017-07-03 DIAGNOSIS — M546 Pain in thoracic spine: Secondary | ICD-10-CM

## 2017-07-03 MED ORDER — METHOCARBAMOL 500 MG PO TABS: 500.0000 mg | ORAL_TABLET | Freq: Two times a day (BID) | ORAL | 0 refills | Status: DC

## 2017-07-03 MED ORDER — NAPROXEN 375 MG PO TABS: 375.0000 mg | ORAL_TABLET | Freq: Two times a day (BID) | ORAL | 0 refills | Status: DC

## 2017-07-03 NOTE — ED Notes (Signed)
ED Provider at bedside. 
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
## 2017-07-03 NOTE — Discharge Instructions (Addendum)
Your imaging was reassuring.  This likely a sprain and will need to be seen by your primary care doctor if not improved.  Return to ED with any worsening symptoms.  Try heat to the affected area. Please take the Naproxen as prescribed for pain. Do not take any additional NSAIDs including Motrin, Aleve, Ibuprofen, Advil. Please the the robaxin for muscle relaxation. This medication will make you drowsy so avoid situation that could place you in danger.

## 2017-07-03 NOTE — ED Notes (Signed)
Patient transported to X-ray 

## 2017-07-05 ENCOUNTER — Ambulatory Visit: Payer: Self-pay | Admitting: Allergy and Immunology

## 2017-08-09 ENCOUNTER — Ambulatory Visit (INDEPENDENT_AMBULATORY_CARE_PROVIDER_SITE_OTHER): Payer: 59 | Admitting: Allergy and Immunology

## 2017-08-09 ENCOUNTER — Encounter: Payer: Self-pay | Admitting: Allergy and Immunology

## 2017-08-09 VITALS — BP 116/68 | HR 85 | Temp 98.2°F | Resp 16 | Ht 67.0 in | Wt 204.4 lb

## 2017-08-09 DIAGNOSIS — R0609 Other forms of dyspnea: Secondary | ICD-10-CM | POA: Diagnosis not present

## 2017-08-09 DIAGNOSIS — J310 Chronic rhinitis: Secondary | ICD-10-CM | POA: Insufficient documentation

## 2017-08-09 DIAGNOSIS — R0600 Dyspnea, unspecified: Secondary | ICD-10-CM | POA: Insufficient documentation

## 2017-08-09 MED ORDER — FLUTICASONE PROPIONATE 50 MCG/ACT NA SUSP: NASAL | 5 refills | Status: AC

## 2017-08-09 MED ORDER — BUDESONIDE-FORMOTEROL FUMARATE 160-4.5 MCG/ACT IN AERO: INHALATION_SPRAY | RESPIRATORY_TRACT | 5 refills | Status: AC

## 2017-08-09 MED ORDER — ALBUTEROL SULFATE HFA 108 (90 BASE) MCG/ACT IN AERS: INHALATION_SPRAY | RESPIRATORY_TRACT | 1 refills | Status: AC

## 2017-08-09 NOTE — Assessment & Plan Note (Signed)
We were unable to perform skin tests today due to recent administration of antihistamine.   A prescription has been provided for fluticasone nasal spray, one spray per nostril 1-2 times daily as needed. Proper nasal spray technique has been discussed and demonstrated.  Nasal saline spray (i.e., Simply Saline) or nasal saline lavage (i.e., NeilMed) is recommended as needed and prior to medicated nasal sprays.  The patient is scheduled to return in the near future for allergy skin testing after having been off of antihistamines for at least 3 days. Further recommendations will be made at that time based upon skin test results.

## 2017-08-09 NOTE — Patient Instructions (Addendum)
Coughing/dyspnea The patients history suggests asthma, however spirometry results today do not meet ATS criteria for that diagnosis.  We will proceed with a therapeutic trial of ICS/LABA.  A prescription has been provided for Symbicort (budesonide/formoterol) 160/4.5 g,  2 inhalations twice a day. To maximize pulmonary deposition, a spacer has been provided along with instructions for its proper administration with an HFA inhaler.  A prescription has been provided for albuterol HFA, 1 to 2 inhalations every 6 hours if needed.  Subjective and objective measures of pulmonary function will be followed and the treatment plan will be adjusted accordingly.  Chronic rhinitis We were unable to perform skin tests today due to recent administration of antihistamine.   A prescription has been provided for fluticasone nasal spray, one spray per nostril 1-2 times daily as needed. Proper nasal spray technique has been discussed and demonstrated.  Nasal saline spray (i.e., Simply Saline) or nasal saline lavage (i.e., NeilMed) is recommended as needed and prior to medicated nasal sprays.  The patient is scheduled to return in the near future for allergy skin testing after having been off of antihistamines for at least 3 days. Further recommendations will be made at that time based upon skin test results.   Return for for allergy skin testing having been off of amitriptyline for at least 3 days..Marland Kitchen

## 2017-08-09 NOTE — Progress Notes (Signed)
New Patient Note  RE: Brooke Silva MRN: [id number] DOB: November 21, 1985 Date of Office Visit: 08/09/2017  Referring provider: Ilona Sorrel, NP Primary care provider: Amelia Jo, FNP  Chief Complaint: Cough; Shortness of Breath; and Allergic Rhinitis    History of present illness: Brooke Silva is a 32 y.o. female seen today in consultation requested by Ilona Sorrel, NP.  She complains of shortness of breath, coughing, and mucus production.  She is concerned because her mother has been diagnosed with COPD and has similar symptoms. The patient's boyfriend has heard her wheeze on a few occasions, however the patient has not noticed this herself.  Her lower respiratory symptoms are increased with upper respiratory tract infections, heat, but also occur at rest without any identified triggers. Brooke Silva experiences nasal congestion, rhinorrhea, postnasal drainage, sinus pressure, and occasional ocular pruritus.  The symptoms are most frequent and severe during the winter and spring months.  She takes over-the-counter antihistamines in an attempt to control these symptoms.  Assessment and plan: Coughing/dyspnea The patients history suggests asthma, however spirometry results today do not meet ATS criteria for that diagnosis.  We will proceed with a therapeutic trial of ICS/LABA.  A prescription has been provided for Symbicort (budesonide/formoterol) 160/4.5 g,  2 inhalations twice a day. To maximize pulmonary deposition, a spacer has been provided along with instructions for its proper administration with an HFA inhaler.  A prescription has been provided for albuterol HFA, 1 to 2 inhalations every 6 hours if needed.  Subjective and objective measures of pulmonary function will be followed and the treatment plan will be adjusted accordingly.  Chronic rhinitis We were unable to perform skin tests today due to recent administration of antihistamine.   A prescription has  been provided for fluticasone nasal spray, one spray per nostril 1-2 times daily as needed. Proper nasal spray technique has been discussed and demonstrated.  Nasal saline spray (i.e., Simply Saline) or nasal saline lavage (i.e., NeilMed) is recommended as needed and prior to medicated nasal sprays.  The patient is scheduled to return in the near future for allergy skin testing after having been off of antihistamines for at least 3 days. Further recommendations will be made at that time based upon skin test results.   Meds ordered this encounter  Medications  . budesonide-formoterol (SYMBICORT) 160-4.5 MCG/ACT inhaler    Sig: Two puffs with spacer twice a day    Dispense:  1 Inhaler    Refill:  5  . albuterol (PROVENTIL HFA;VENTOLIN HFA) 108 (90 Base) MCG/ACT inhaler    Sig: 1-2 puffs every 6 hours as needed for cough or wheeze.    Dispense:  18 g    Refill:  1  . fluticasone (FLONASE) 50 MCG/ACT nasal spray    Sig: One spray each nostril 1-2 times a day as needed.    Dispense:  16 g    Refill:  5    Diagnostics: Spirometry: FVC was 3.32 L (94% predicted) and FEV1 was 3.12 L (105% predicted) without significant postbronchodilator improvement.  This study was performed while the patient was asymptomatic.  Please see scanned spirometry results for details. Allergy skin testing: We were unable to perform skin tests today due to recent administration of antihistamine.     Physical examination: Blood pressure 116/68, pulse 85, temperature 98.2 F (36.8 C), temperature source Oral, resp. rate 16, height 5\' 7"  (1.702 m), weight 204 lb 5.9 oz (92.7 kg), SpO2 96 %.  General: Alert, interactive,  in no acute distress. HEENT: TMs pearly gray, turbinates moderately edematous with clear discharge, post-pharynx erythematous. Neck: Supple without lymphadenopathy. Lungs: Clear to auscultation without wheezing, rhonchi or rales. CV: Normal S1, S2 without murmurs. Abdomen: Nondistended,  nontender. Skin: Warm and dry, without lesions or rashes. Extremities:  No clubbing, cyanosis or edema. Neuro:   Grossly intact.  Review of systems:  Review of systems negative except as noted in HPI / PMHx or noted below: Review of Systems  Constitutional: Negative.   HENT: Negative.   Eyes: Negative.   Respiratory: Negative.   Cardiovascular: Negative.   Gastrointestinal: Negative.   Genitourinary: Negative.   Musculoskeletal: Negative.   Skin: Negative.   Neurological: Negative.   Endo/Heme/Allergies: Negative.   Psychiatric/Behavioral: Negative.     Past medical history:  Past Medical History:  Diagnosis Date  . Back pain   . Spinal stenosis, cervical region     Past surgical history:  Past Surgical History:  Procedure Laterality Date  . no past surgery      Family history: Family History  Problem Relation Age of Onset  . Asthma Mother   . Eczema Son   . Food Allergy Daughter   . Allergic rhinitis Neg Hx   . Angioedema Neg Hx   . Immunodeficiency Neg Hx   . Urticaria Neg Hx     Social history: Social History   Socioeconomic History  . Marital status: Single    Spouse name: Not on file  . Number of children: Not on file  . Years of education: Not on file  . Highest education level: Not on file  Occupational History  . Not on file  Social Needs  . Financial resource strain: Not on file  . Food insecurity:    Worry: Not on file    Inability: Not on file  . Transportation needs:    Medical: Not on file    Non-medical: Not on file  Tobacco Use  . Smoking status: Never Smoker  . Smokeless tobacco: Never Used  Substance and Sexual Activity  . Alcohol use: No    Alcohol/week: 0.0 oz  . Drug use: No  . Sexual activity: Yes    Birth control/protection: None  Lifestyle  . Physical activity:    Days per week: Not on file    Minutes per session: Not on file  . Stress: Not on file  Relationships  . Social connections:    Talks on phone: Not on  file    Gets together: Not on file    Attends religious service: Not on file    Active member of club or organization: Not on file    Attends meetings of clubs or organizations: Not on file    Relationship status: Not on file  . Intimate partner violence:    Fear of current or ex partner: Not on file    Emotionally abused: Not on file    Physically abused: Not on file    Forced sexual activity: Not on file  Other Topics Concern  . Not on file  Social History Narrative  . Not on file   Environmental History: The patient lives in an apartment with carpeting the bedroom and central air/heat.  There is no known mold/water damage in the home.  She is a non-smoker without pets.  Allergies as of 08/09/2017   No Known Allergies     Medication List        Accurate as of 08/09/17  4:31 PM. Always use  your most recent med list.          albuterol 108 (90 Base) MCG/ACT inhaler Commonly known as:  PROVENTIL HFA;VENTOLIN HFA 1-2 puffs every 6 hours as needed for cough or wheeze.   amitriptyline 10 MG tablet Commonly known as:  ELAVIL Take by mouth.   budesonide-formoterol 160-4.5 MCG/ACT inhaler Commonly known as:  SYMBICORT Two puffs with spacer twice a day   fluticasone 50 MCG/ACT nasal spray Commonly known as:  FLONASE One spray each nostril 1-2 times a day as needed.   gabapentin 300 MG capsule Commonly known as:  NEURONTIN TAKE 1 CAPSULE (300 MG TOTAL) BY MOUTH 3 TIMES DAILY.   medroxyPROGESTERone 150 MG/ML injection Commonly known as:  DEPO-PROVERA Inject 150 mg into the muscle every 3 (three) months.   meloxicam 7.5 MG tablet Commonly known as:  MOBIC Take 7.5 mg by mouth 2 (two) times daily.   omeprazole 20 MG capsule Commonly known as:  PRILOSEC Take 1 capsule by mouth daily.       Known medication allergies: No Known Allergies  I appreciate the opportunity to take part in Brooke Silva's care. Please do not hesitate to contact me with  questions.  Sincerely,   R. Jorene Guestarter Glennie Rodda, MD

## 2017-08-09 NOTE — Assessment & Plan Note (Addendum)
The patients history suggests asthma, however spirometry results today do not meet ATS criteria for that diagnosis.  We will proceed with a therapeutic trial of ICS/LABA.  A prescription has been provided for Symbicort (budesonide/formoterol) 160/4.5 g, 2 inhalations twice a day. To maximize pulmonary deposition, a spacer has been provided along with instructions for its proper administration with an HFA inhaler.  A prescription has been provided for albuterol HFA, 1 to 2 inhalations every 6 hours if needed.  Subjective and objective measures of pulmonary function will be followed and the treatment plan will be adjusted accordingly.

## 2017-08-12 ENCOUNTER — Encounter (HOSPITAL_BASED_OUTPATIENT_CLINIC_OR_DEPARTMENT_OTHER): Payer: Self-pay | Admitting: Emergency Medicine

## 2017-08-12 ENCOUNTER — Emergency Department (HOSPITAL_BASED_OUTPATIENT_CLINIC_OR_DEPARTMENT_OTHER)
Admission: EM | Admit: 2017-08-12 | Discharge: 2017-08-12 | Disposition: A | Discharge status: Home / Self Care | Payer: 59 | Attending: Emergency Medicine | Admitting: Emergency Medicine

## 2017-08-12 ENCOUNTER — Other Ambulatory Visit: Payer: Self-pay

## 2017-08-12 DIAGNOSIS — J020 Streptococcal pharyngitis: Secondary | ICD-10-CM | POA: Insufficient documentation

## 2017-08-12 DIAGNOSIS — Z79899 Other long term (current) drug therapy: Secondary | ICD-10-CM | POA: Insufficient documentation

## 2017-08-12 DIAGNOSIS — R070 Pain in throat: Secondary | ICD-10-CM | POA: Diagnosis present

## 2017-08-12 LAB — RAPID STREP SCREEN (MED CTR MEBANE ONLY): Streptococcus, Group A Screen (Direct): POSITIVE — AB

## 2017-08-12 MED ORDER — ACETAMINOPHEN 325 MG PO TABS
650.0000 mg | ORAL_TABLET | Freq: Once | ORAL | Status: AC | PRN
  Administered 2017-08-12: 650 mg via ORAL
  Filled 2017-08-12: qty 2

## 2017-08-12 MED ORDER — PENICILLIN G BENZATHINE 1200000 UNIT/2ML IM SUSP
1.2000 10*6.[IU] | Freq: Once | INTRAMUSCULAR | Status: AC
  Administered 2017-08-12: 1.2 10*6.[IU] via INTRAMUSCULAR
  Filled 2017-08-12: qty 2

## 2017-08-12 MED ORDER — FLUCONAZOLE 150 MG PO TABS: 150.0000 mg | ORAL_TABLET | Freq: Once | ORAL | 0 refills | Status: AC

## 2017-08-12 NOTE — Discharge Instructions (Signed)
Your work-up revealed strep pharyngitis as the cause of your sore throat.  We gave you the injection antibiotic to treat this.  Please follow-up with your primary care physician for further management and reassessment.  Please continue treating her fevers at home.  If any symptoms change or worsen, please return to the emergency department.  Please stay hydrated.

## 2017-08-12 NOTE — ED Provider Notes (Signed)
[facility] EMERGENCY DEPARTMENT Provider Note   CSN: [id number] Arrival date & time: 08/12/17  0707     History   Chief Complaint Chief Complaint  Patient presents with  . Sore Throat    HPI Brooke Silva is a 32 y.o. female.  The history is provided by the patient and medical records.  Sore Throat  This is a new problem. The current episode started yesterday. The problem occurs constantly. The problem has not changed since onset.Pertinent negatives include no chest pain, no abdominal pain, no headaches and no shortness of breath. Nothing aggravates the symptoms. Nothing relieves the symptoms. She has tried nothing for the symptoms. The treatment provided no relief.    Past Medical History:  Diagnosis Date  . Back pain   . Spinal stenosis, cervical region     Patient Active Problem List   Diagnosis Date Noted  . Coughing/dyspnea 08/09/2017  . Chronic rhinitis 08/09/2017  . Bilateral knee pain 02/03/2016  . Right shoulder injury 05/18/2015  . Right wrist injury 05/18/2015  . Low back pain 05/18/2015    Past Surgical History:  Procedure Laterality Date  . no past surgery       OB History   None      Home Medications    Prior to Admission medications   Medication Sig Start Date End Date Taking? Authorizing Provider  albuterol (PROVENTIL HFA;VENTOLIN HFA) 108 (90 Base) MCG/ACT inhaler 1-2 puffs every 6 hours as needed for cough or wheeze. 08/09/17   Bobbitt, Heywood Ilesalph Carter, MD  amitriptyline (ELAVIL) 10 MG tablet Take by mouth. 01/11/17 01/11/18  [provider]  budesonide-formoterol [facility](SYMBICORT) 160-4.5 MCG/ACT inhaler Two puffs with spacer twice a day 08/09/17   Bobbitt, Heywood Ilesalph Carter, MD  fluticasone [facility](FLONASE) 50 MCG/ACT nasal spray One spray each nostril 1-2 times a day as needed. 08/09/17   Bobbitt, Heywood Ilesalph Carter, MD  gabapentin (NEURONTIN) 300 MG capsule TAKE 1 CAPSULE (300 MG TOTAL) BY MOUTH 3 TIMES DAILY. 08/01/17   [provider]  medroxyPROGESTERone (DEPO-PROVERA) 150 MG/ML injection Inject 150 mg into the muscle every 3 (three) months.    [provider]  meloxicam (MOBIC) 7.5 MG tablet Take 7.5 mg by mouth 2 (two) times daily.    [provider]  omeprazole (PRILOSEC) 20 MG capsule Take 1 capsule by mouth daily. 05/08/17   [provider]    Family History Family History  Problem Relation Age of Onset  . Asthma Mother   . Eczema Son   . Food Allergy Daughter   . Allergic rhinitis Neg Hx   . Angioedema Neg Hx   . Immunodeficiency Neg Hx   . Urticaria Neg Hx     Social History Social History   Tobacco Use  . Smoking status: Never Smoker  . Smokeless tobacco: Never Used  Substance Use Topics  . Alcohol use: No    Alcohol/week: 0.0 oz  . Drug use: No     Allergies   Patient has no known allergies.   Review of Systems Review of Systems  Constitutional: Positive for chills and diaphoresis. Negative for fatigue and fever.  HENT: Positive for congestion, rhinorrhea and sore throat. Negative for dental problem and trouble swallowing.   Eyes: Negative for visual disturbance.  Respiratory: Negative for cough, chest tightness, shortness of breath, wheezing and stridor.   Cardiovascular: Negative for chest pain.  Gastrointestinal: Negative for abdominal pain, constipation, diarrhea, nausea and vomiting.  Genitourinary: Negative for dysuria.  Musculoskeletal: Negative for  back pain, neck pain and neck stiffness.  Skin: Negative for rash and wound.  Neurological: Negative for syncope, light-headedness and headaches.  Psychiatric/Behavioral: Negative for agitation.  All other systems reviewed and are negative.    Physical Exam Updated Vital Signs BP 127/84 (BP Location: Left Arm)   Pulse (!) 114   Temp (!) 101.4 F (38.6 C) (Oral)   Resp 18   Ht 5\' 7"  (1.702 m)   Wt 92.5 kg (204 lb)   SpO2 100%   BMI 31.95 kg/m   Physical Exam  Constitutional: She is oriented  to person, place, and time. She appears well-developed and well-nourished.  Non-toxic appearance. She does not appear ill. No distress.  HENT:  Head: Normocephalic and atraumatic.  Mouth/Throat: Uvula is midline. She does not have dentures. No oral lesions. No uvula swelling. Oropharyngeal exudate and posterior oropharyngeal erythema present. No posterior oropharyngeal edema or tonsillar abscesses. Tonsillar exudate.  Eyes: Pupils are equal, round, and reactive to light. Conjunctivae are normal. No scleral icterus.  Neck: Normal range of motion. Neck supple.  Cardiovascular: Normal rate and intact distal pulses.  No murmur heard. Pulmonary/Chest: Effort normal and breath sounds normal. No respiratory distress. She has no wheezes. She exhibits no tenderness.  Abdominal: Soft. Bowel sounds are normal. There is no tenderness.  Musculoskeletal: She exhibits no edema or tenderness.  Lymphadenopathy:    She has no cervical adenopathy.  Neurological: She is alert and oriented to person, place, and time. No sensory deficit. She exhibits normal muscle tone.  Skin: Capillary refill takes less than 2 seconds. No rash noted. She is not diaphoretic. No erythema.  Psychiatric: She has a normal mood and affect.  Nursing note and vitals reviewed.    ED Treatments / Results  Labs (all labs ordered are listed, but only abnormal results are displayed) Labs Reviewed  RAPID STREP SCREEN ([facility] ONLY) - Abnormal; Notable for the following components:      Result Value   Streptococcus, Group A Screen (Direct) POSITIVE (*)    All other components within normal limits    EKG None  Radiology No results found.  Procedures Procedures (including critical care time)  Medications Ordered in ED Medications  acetaminophen (TYLENOL) tablet 650 mg (650 mg Oral Given 08/12/17 0720)  penicillin g benzathine (BICILLIN LA) 1200000 UNIT/2ML injection 1.2 Million Units (1.2 Million Units Intramuscular Given  08/12/17 0813)     Initial Impression / Assessment and Plan / ED Course  I have reviewed the triage vital signs and the nursing notes.  Pertinent labs & imaging results that were available during my care of the patient were reviewed by me and considered in my medical decision making (see chart for details).     Brooke Silva is a 32 y.o. female with a past medical history significant for chronic rhinitis and spinal stenosis who presents with sore throat.  Patient reports that she has been having a sore throat since yesterday.  She reports that she  has pain with swallowing but is still able to tolerate p.o. and fluids.  She reports no neck stiffness or inability to move her neck.  She denies trismus or significant voice changes.  she reports she is coughed up some brown  sputum but overall has not had a cough.  She reports some rhinorrhea and congestion.  She reports subjective fevers and chills since yesterday.  She denies chest pain, abdominal pain, or any other symptoms.  She denies urinary symptoms or GI symptoms.  She denies nausea or vomiting.    On exam, patient has posterior oropharyngeal erythema.  Uvula is midline.  No evidence of PTA or RPA.  Patient does have tonsillar exudate on the right.  Patient's lungs are clear.  No stridor on  throat exam. Exam otherwise unremarkable.  No posterior or anterior cervical lymph nodes.  Doubt mono.  Patient's vital signs showed elevated temperature and tachycardia.  Suspect this is due to the  possible pharyngitis.  Patient will have strep swab to look for bacterial pharyngitis.  Patient will be given oral fluids since she reports she is able to swallow.    Patient will be on Tylenol for her fever.  Anticipate reassessment after strep swab.  8:21 AM Strep swab returned positive.  Patient has strep pharyngitis.  No evidence of more concerning deep space neck infection.  Patient elected to have the shot of antibiotics to treat.  Patient had no  difficulty with swallowing or breathing.  Patient was felt stable for discharge home.  Patient discharged in good condition with instructions to follow-up with PCP as well as return precautions.    Final Clinical Impressions(s) / ED Diagnoses   Final diagnoses:  Strep pharyngitis    ED Discharge Orders    None      Clinical Impression: 1. Strep pharyngitis     Disposition: Discharge  Condition: Good  I have discussed the results, Dx and Tx plan with the pt(& family if present). He/she/they expressed understanding and agree(s) with the plan. Discharge instructions discussed at great length. Strict return precautions discussed and pt &/or family have verbalized understanding of the instructions. No further questions at time of discharge.    New Prescriptions   No medications on file    Follow Up: Amelia Jo, FNP [address] [place] [address] [phone]     [facility] [facility] [address] [id number] YQ [facility] [place] [address] [phone]       Adamari Frede, Canary Brim, MD 08/12/17 1650

## 2017-08-12 NOTE — ED Triage Notes (Signed)
Sore throat since yesterday.

## 2017-08-12 NOTE — ED Notes (Signed)
ED Provider at bedside. 

## 2017-08-31 ENCOUNTER — Ambulatory Visit: Payer: 59 | Admitting: Family Medicine

## 2017-09-21 ENCOUNTER — Ambulatory Visit: Payer: 59 | Admitting: Family Medicine

## 2017-10-08 ENCOUNTER — Emergency Department (HOSPITAL_BASED_OUTPATIENT_CLINIC_OR_DEPARTMENT_OTHER)
Admission: EM | Admit: 2017-10-08 | Discharge: 2017-10-09 | Disposition: A | Discharge status: Home / Self Care | Payer: 59 | Attending: Emergency Medicine | Admitting: Emergency Medicine

## 2017-10-08 ENCOUNTER — Other Ambulatory Visit: Payer: Self-pay

## 2017-10-08 ENCOUNTER — Encounter (HOSPITAL_BASED_OUTPATIENT_CLINIC_OR_DEPARTMENT_OTHER): Payer: Self-pay | Admitting: *Deleted

## 2017-10-08 DIAGNOSIS — Z79899 Other long term (current) drug therapy: Secondary | ICD-10-CM | POA: Diagnosis not present

## 2017-10-08 DIAGNOSIS — R51 Headache: Secondary | ICD-10-CM | POA: Insufficient documentation

## 2017-10-08 DIAGNOSIS — R1013 Epigastric pain: Secondary | ICD-10-CM | POA: Diagnosis present

## 2017-10-08 DIAGNOSIS — R519 Headache, unspecified: Secondary | ICD-10-CM

## 2017-10-08 LAB — COMPREHENSIVE METABOLIC PANEL
ALBUMIN: 3 g/dL — AB (ref 3.5–5.0)
ALK PHOS: 50 U/L (ref 38–126)
ALT: 28 U/L (ref 0–44)
ANION GAP: 6 (ref 5–15)
AST: 27 U/L (ref 15–41)
BILIRUBIN TOTAL: 0.3 mg/dL (ref 0.3–1.2)
BUN: 10 mg/dL (ref 6–20)
CALCIUM: 8.5 mg/dL — AB (ref 8.9–10.3)
CO2: 27 mmol/L (ref 22–32)
CREATININE: 1.14 mg/dL — AB (ref 0.44–1.00)
Chloride: 107 mmol/L (ref 98–111)
GFR calc Af Amer: >60 mL/min (ref >60)
GFR calc non Af Amer: >60 mL/min (ref >60)
Glucose, Bld: 120 mg/dL — ABNORMAL HIGH (ref 70–99)
Potassium: 3.5 mmol/L (ref 3.5–5.1)
Sodium: 140 mmol/L (ref 135–145)
TOTAL PROTEIN: 5.6 g/dL — AB (ref 6.5–8.1)

## 2017-10-08 LAB — URINALYSIS, ROUTINE W REFLEX MICROSCOPIC
Bilirubin Urine: NEGATIVE
Glucose, UA: NEGATIVE mg/dL
Hgb urine dipstick: NEGATIVE
KETONES UR: NEGATIVE mg/dL
Leukocytes, UA: NEGATIVE
NITRITE: NEGATIVE
PROTEIN: NEGATIVE mg/dL
SPECIFIC GRAVITY, URINE: 1.02 (ref 1.005–1.030)
pH: 6 (ref 5.0–8.0)

## 2017-10-08 LAB — CBC
HCT: 35.4 % — ABNORMAL LOW (ref 36.0–46.0)
HEMOGLOBIN: 11.4 g/dL — AB (ref 12.0–15.0)
MCH: 26.6 pg (ref 26.0–34.0)
MCHC: 32.2 g/dL (ref 30.0–36.0)
MCV: 82.5 fL (ref 78.0–100.0)
PLATELETS: 324 10*3/uL (ref 150–400)
RBC: 4.29 MIL/uL (ref 3.87–5.11)
RDW: 13.5 % (ref 11.5–15.5)
WBC: 7.5 10*3/uL (ref 4.0–10.5)

## 2017-10-08 LAB — LIPASE, BLOOD: Lipase: 29 U/L (ref 11–51)

## 2017-10-08 LAB — PREGNANCY, URINE: PREG TEST UR: NEGATIVE

## 2017-10-08 MED ORDER — SODIUM CHLORIDE 0.9 % IV BOLUS
1000.0000 mL | Freq: Once | INTRAVENOUS | Status: AC
  Administered 2017-10-09: 1000 mL via INTRAVENOUS

## 2017-10-08 MED ORDER — DIPHENHYDRAMINE HCL 50 MG/ML IJ SOLN
25.0000 mg | Freq: Once | INTRAMUSCULAR | Status: AC
  Administered 2017-10-09: 25 mg via INTRAVENOUS
  Filled 2017-10-08: qty 1

## 2017-10-08 MED ORDER — METOCLOPRAMIDE HCL 5 MG/ML IJ SOLN
10.0000 mg | Freq: Once | INTRAMUSCULAR | Status: AC
  Administered 2017-10-09: 10 mg via INTRAVENOUS
  Filled 2017-10-08: qty 2

## 2017-10-08 MED ORDER — SUCRALFATE 1 GM/10ML PO SUSP
1.0000 g | Freq: Once | ORAL | Status: AC
  Administered 2017-10-09: 1 g via ORAL
  Filled 2017-10-08: qty 10

## 2017-10-08 NOTE — ED Triage Notes (Signed)
Pt reports HA x 2 days. States she vomited yesterday and then again this afternoon. Also c/o upper abd pain and is concerned because she has been recently treated for H. Pylori

## 2017-10-08 NOTE — ED Provider Notes (Signed)
[facility] Provider Note: Lowella DellJ. Lane Catherina Pates, MD, FACEP  CSN: [id number] MRN: [id number] ARRIVAL: 10/08/17 at 2213 ROOM: MH10/MH10   CHIEF COMPLAINT  Abdominal Pain   HISTORY OF PRESENT ILLNESS  10/08/17 11:46 PM Brooke Silva is a 32 y.o. female with a 2-day history of epigastric pain, nausea and vomiting.  She describes the pain as dull and rates it as an 8 out of 10.  The pain is like that of previous H. pylori infection.  She denies associated diarrhea.  She is also had a headache today which is frontal.  She states the pain is not severe at this time.  She has not taken anything for her abdominal pain other than her usual daily omeprazole.   Past Medical History:  Diagnosis Date  . Back pain   . Spinal stenosis, cervical region     Past Surgical History:  Procedure Laterality Date  . no past surgery      Family History  Problem Relation Age of Onset  . Asthma Mother   . Eczema Son   . Food Allergy Daughter   . Allergic rhinitis Neg Hx   . Angioedema Neg Hx   . Immunodeficiency Neg Hx   . Urticaria Neg Hx     Social History   Tobacco Use  . Smoking status: Never Smoker  . Smokeless tobacco: Never Used  Substance Use Topics  . Alcohol use: No    Alcohol/week: 0.0 standard drinks  . Drug use: No    Prior to Admission medications   Medication Sig Start Date End Date Taking? Authorizing Provider  albuterol (PROVENTIL HFA;VENTOLIN HFA) 108 (90 Base) MCG/ACT inhaler 1-2 puffs every 6 hours as needed for cough or wheeze. 08/09/17   Bobbitt, Heywood Ilesalph Carter, MD  amitriptyline (ELAVIL) 10 MG tablet Take by mouth. 01/11/17 01/11/18  [provider]  budesonide-formoterol [facility](SYMBICORT) 160-4.5 MCG/ACT inhaler Two puffs with spacer twice a day 08/09/17   Bobbitt, Heywood Ilesalph Carter, MD  fluticasone [facility](FLONASE) 50 MCG/ACT nasal spray One spray each nostril 1-2 times a day as needed. 08/09/17   Bobbitt, Heywood Ilesalph Carter, MD  gabapentin (NEURONTIN) 300 MG capsule TAKE 1  CAPSULE (300 MG TOTAL) BY MOUTH 3 TIMES DAILY. 08/01/17   [provider]  medroxyPROGESTERone (DEPO-PROVERA) 150 MG/ML injection Inject 150 mg into the muscle every 3 (three) months.    [provider]  meloxicam (MOBIC) 7.5 MG tablet Take 7.5 mg by mouth 2 (two) times daily.    [provider]  omeprazole (PRILOSEC) 20 MG capsule Take 1 capsule by mouth daily. 05/08/17   [provider]    Allergies Patient has no known allergies.   REVIEW OF SYSTEMS  Negative except as noted here or in the History of Present Illness.   PHYSICAL EXAMINATION  Initial Vital Signs Blood pressure 123/73, pulse 80, temperature 98.4 F (36.9 C), temperature source Oral, resp. rate 18, height 5\' 7"  (1.702 m), weight 93.4 kg, SpO2 100 %.  Examination General: Well-developed, well-nourished female in no acute distress; appearance consistent with age of record HENT: normocephalic; atraumatic Eyes: pupils equal, round and reactive to light; extraocular muscles intact Neck: supple Heart: regular rate and rhythm Lungs: clear to auscultation bilaterally Abdomen: soft; nondistended; epigastric tenderness; no masses or hepatosplenomegaly; bowel sounds present Extremities: No deformity; full range of motion; pulses normal Neurologic: Awake, alert and oriented; motor function intact in all extremities and symmetric; no facial droop Skin: Warm and dry Psychiatric: Normal mood and affect   RESULTS  Summary of this visit's results, reviewed by myself:   EKG Interpretation  Date/Time:    Ventricular Rate:    PR Interval:    QRS Duration:   QT Interval:    QTC Calculation:   R Axis:     Text Interpretation:        Laboratory Studies: Results for orders placed or performed during the hospital encounter of 10/08/17 (from the past 24 hour(s))  Lipase, blood     Status: None   Collection Time: 10/08/17 11:36 PM  Result Value Ref Range   Lipase 29 11 - 51 U/L    Comprehensive metabolic panel     Status: Abnormal   Collection Time: 10/08/17 11:36 PM  Result Value Ref Range   Sodium 140 135 - 145 mmol/L   Potassium 3.5 3.5 - 5.1 mmol/L   Chloride 107 98 - 111 mmol/L   CO2 27 22 - 32 mmol/L   Glucose, Bld 120 (H) 70 - 99 mg/dL   BUN 10 6 - 20 mg/dL   Creatinine, Ser 1.61 (H) 0.44 - 1.00 mg/dL   Calcium 8.5 (L) 8.9 - 10.3 mg/dL   Total Protein 5.6 (L) 6.5 - 8.1 g/dL   Albumin 3.0 (L) 3.5 - 5.0 g/dL   AST 27 15 - 41 U/L   ALT 28 0 - 44 U/L   Alkaline Phosphatase 50 38 - 126 U/L   Total Bilirubin 0.3 0.3 - 1.2 mg/dL   GFR calc non Af Amer >60 >60 mL/min   GFR calc Af Amer >60 >60 mL/min   Anion gap 6 5 - 15  CBC     Status: Abnormal   Collection Time: 10/08/17 11:36 PM  Result Value Ref Range   WBC 7.5 4.0 - 10.5 K/uL   RBC 4.29 3.87 - 5.11 MIL/uL   Hemoglobin 11.4 (L) 12.0 - 15.0 g/dL   HCT 09.6 (L) 04.5 - 40.9 %   MCV 82.5 78.0 - 100.0 fL   MCH 26.6 26.0 - 34.0 pg   MCHC 32.2 30.0 - 36.0 g/dL   RDW 81.1 91.4 - 78.2 %   Platelets 324 150 - 400 K/uL  Urinalysis, Routine w reflex microscopic     Status: None   Collection Time: 10/08/17 11:36 PM  Result Value Ref Range   Color, Urine YELLOW YELLOW   APPearance CLEAR CLEAR   Specific Gravity, Urine 1.020 1.005 - 1.030   pH 6.0 5.0 - 8.0   Glucose, UA NEGATIVE NEGATIVE mg/dL   Hgb urine dipstick NEGATIVE NEGATIVE   Bilirubin Urine NEGATIVE NEGATIVE   Ketones, ur NEGATIVE NEGATIVE mg/dL   Protein, ur NEGATIVE NEGATIVE mg/dL   Nitrite NEGATIVE NEGATIVE   Leukocytes, UA NEGATIVE NEGATIVE  Pregnancy, urine     Status: None   Collection Time: 10/08/17 11:36 PM  Result Value Ref Range   Preg Test, Ur NEGATIVE NEGATIVE   Imaging Studies: No results found.  ED COURSE and MDM  Nursing notes and initial vitals signs, including pulse oximetry, reviewed.  Vitals:   10/08/17 2227 10/08/17 2228  BP:  123/73  Pulse:  80  Resp:  18  Temp:  98.4 F (36.9 C)  TempSrc:  Oral  SpO2:   100%  Weight: 93.4 kg   Height: 5\' 7"  (1.702 m)    12:32 AM Patient given oral Carafate and IV Benadryl and Reglan.  She states she is ready to go home at this time as she would be more comfortable at home.  She  does not wish any further treatment.  She was advised that H. pylori is difficult to assess and treat in the emergency department so she should follow-up with her primary care physician.  PROCEDURES    ED DIAGNOSES     ICD-10-CM   1. Epigastric pain R10.13   2. Frontal headache R51        Tommye Lehenbauer, Jonny Ruiz, MD 10/09/17 [phone]

## 2017-10-09 NOTE — ED Notes (Signed)
Pt called out stating that she would like to leave and would be more comfortable in her bed at home. Also requesting a work note. Provider aware

## 2017-10-09 NOTE — ED Notes (Signed)
Pt understood dc material. NAD noted. Work excuse given at dc 

## 2017-11-17 ENCOUNTER — Ambulatory Visit: Payer: Self-pay

## 2017-11-17 ENCOUNTER — Other Ambulatory Visit: Payer: Self-pay | Admitting: Occupational Medicine

## 2017-11-17 DIAGNOSIS — M546 Pain in thoracic spine: Secondary | ICD-10-CM

## 2017-11-20 ENCOUNTER — Encounter (HOSPITAL_BASED_OUTPATIENT_CLINIC_OR_DEPARTMENT_OTHER): Payer: Self-pay | Admitting: Emergency Medicine

## 2017-11-20 ENCOUNTER — Other Ambulatory Visit: Payer: Self-pay

## 2017-11-20 ENCOUNTER — Emergency Department (HOSPITAL_BASED_OUTPATIENT_CLINIC_OR_DEPARTMENT_OTHER)
Admission: EM | Admit: 2017-11-20 | Discharge: 2017-11-21 | Disposition: A | Discharge status: Home / Self Care | Payer: 59 | Attending: Emergency Medicine | Admitting: Emergency Medicine

## 2017-11-20 ENCOUNTER — Emergency Department (HOSPITAL_BASED_OUTPATIENT_CLINIC_OR_DEPARTMENT_OTHER): Payer: 59

## 2017-11-20 DIAGNOSIS — Y990 Civilian activity done for income or pay: Secondary | ICD-10-CM | POA: Diagnosis not present

## 2017-11-20 DIAGNOSIS — Z79899 Other long term (current) drug therapy: Secondary | ICD-10-CM | POA: Insufficient documentation

## 2017-11-20 DIAGNOSIS — W109XXD Fall (on) (from) unspecified stairs and steps, subsequent encounter: Secondary | ICD-10-CM | POA: Insufficient documentation

## 2017-11-20 DIAGNOSIS — M542 Cervicalgia: Secondary | ICD-10-CM | POA: Diagnosis not present

## 2017-11-20 DIAGNOSIS — M545 Low back pain: Secondary | ICD-10-CM | POA: Insufficient documentation

## 2017-11-20 DIAGNOSIS — W108XXD Fall (on) (from) other stairs and steps, subsequent encounter: Secondary | ICD-10-CM

## 2017-11-20 LAB — PREGNANCY, URINE: Preg Test, Ur: NEGATIVE

## 2017-11-20 NOTE — ED Triage Notes (Signed)
Pt states that she fell last Friday and was seen on [facility], they did xray and said everything was ok and discharged her home, pt states she still have neck pain and lower back pain and she wants to be recheck.

## 2017-11-20 NOTE — ED Notes (Signed)
ED Provider at bedside. 

## 2017-11-20 NOTE — ED Notes (Signed)
Ambulatory without difficulty

## 2017-11-20 NOTE — ED Provider Notes (Signed)
[facility] Provider Note: Brooke Dell, MD, FACEP  CSN: [id number] MRN: [id number] ARRIVAL: 11/20/17 at 2318 ROOM: MH02/MH02   CHIEF COMPLAINT  Fall   HISTORY OF PRESENT ILLNESS  11/20/17 11:29 PM Brooke Silva is a 32 y.o. female still down some steps at work 3 days ago.  She was seen at that time and had thoracic spine radiographs which were unremarkable.  She returns complaining of persistent pain in her neck and lumbar regions.  She describes the pain as dull and moderate and is worse with sitting and lying down.  It is making it difficult to sleep.  She denies associated numbness or weakness.  She denies chest or abdominal pain.  She has not taken anything for it except her usual gabapentin which she takes for chronic pain.   Past Medical History:  Diagnosis Date  . Back pain   . Spinal stenosis, cervical region     Past Surgical History:  Procedure Laterality Date  . no past surgery      Family History  Problem Relation Age of Onset  . Asthma Mother   . Eczema Son   . Food Allergy Daughter   . Allergic rhinitis Neg Hx   . Angioedema Neg Hx   . Immunodeficiency Neg Hx   . Urticaria Neg Hx     Social History   Tobacco Use  . Smoking status: Never Smoker  . Smokeless tobacco: Never Used  Substance Use Topics  . Alcohol use: No    Alcohol/week: 0.0 standard drinks  . Drug use: No    Prior to Admission medications   Medication Sig Start Date End Date Taking? Authorizing Provider  albuterol (PROVENTIL HFA;VENTOLIN HFA) 108 (90 Base) MCG/ACT inhaler 1-2 puffs every 6 hours as needed for cough or wheeze. 08/09/17   Bobbitt, Heywood Iles, MD  amitriptyline (ELAVIL) 10 MG tablet Take by mouth. 01/11/17 01/11/18  [provider]  budesonide-formoterol [facility]) 160-4.5 MCG/ACT inhaler Two puffs with spacer twice a day 08/09/17   Bobbitt, Heywood Iles, MD  fluticasone [facility]) 50 MCG/ACT nasal spray One spray each nostril 1-2 times a day  as needed. 08/09/17   Bobbitt, Heywood Iles, MD  gabapentin (NEURONTIN) 300 MG capsule TAKE 1 CAPSULE (300 MG TOTAL) BY MOUTH 3 TIMES DAILY. 08/01/17   [provider]  medroxyPROGESTERone (DEPO-PROVERA) 150 MG/ML injection Inject 150 mg into the muscle every 3 (three) months.    [provider]  meloxicam (MOBIC) 7.5 MG tablet Take 7.5 mg by mouth 2 (two) times daily.    [provider]  omeprazole (PRILOSEC) 20 MG capsule Take 1 capsule by mouth daily. 05/08/17   [provider]    Allergies Patient has no known allergies.   REVIEW OF SYSTEMS  Negative except as noted here or in the History of Present Illness.   PHYSICAL EXAMINATION  Initial Vital Signs Blood pressure 124/77, pulse 82, temperature 98.2 F (36.8 C), temperature source Oral, resp. rate 18, height 5\' 7"  (1.702 m), weight 90.7 kg, SpO2 100 %.  Examination General: Well-developed, well-nourished female in no acute distress; appearance consistent with age of record HENT: normocephalic; atraumatic Eyes: pupils equal, round and reactive to light; extraocular muscles intact Neck: supple; C-spine tenderness Heart: regular rate and rhythm Lungs: clear to auscultation bilaterally Chest: Nontender Abdomen: soft; nondistended; nontender; bowel sounds present Back: Lumbar tenderness Extremities: No deformity; full range of motion; pulses normal Neurologic: Awake, alert and oriented; motor function intact in all extremities and symmetric;  no facial droop Skin: Warm and dry Psychiatric: Normal mood and affect   RESULTS  Summary of this visit's results, reviewed by myself:   EKG Interpretation  Date/Time:    Ventricular Rate:    PR Interval:    QRS Duration:   QT Interval:    QTC Calculation:   R Axis:     Text Interpretation:        Laboratory Studies: Results for orders placed or performed during the hospital encounter of 11/20/17 (from the past 24 hour(s))  Pregnancy, urine      Status: None   Collection Time: 11/20/17 11:48 PM  Result Value Ref Range   Preg Test, Ur NEGATIVE NEGATIVE   Imaging Studies: Dg Cervical Spine Complete  Result Date: 11/21/2017 CLINICAL DATA:  Patient fell on Friday with neck injury. Pain, numbness, and tingling in the left arm. EXAM: CERVICAL SPINE - COMPLETE 4+ VIEW COMPARISON:  07/03/2017 FINDINGS: Normal alignment of the cervical spine without anterior subluxation. Normal alignment of the facet joints. No bone encroachment upon the neural foramina. C1-2 articulation appears intact. No vertebral compression deformities. No prevertebral soft tissue swelling. Degenerative changes mostly at C5-6 with narrowed interspace and associated endplate hypertrophic changes. No focal bone lesion or bone destruction. IMPRESSION: Normal alignment of the cervical spine. No acute displaced fractures identified. Mild degenerative changes. Electronically Signed   By: Burman Nieves M.D.   On: 11/21/2017 00:43   Dg Lumbar Spine Complete  Result Date: 11/21/2017 CLINICAL DATA:  Low back injury with central lumbar pain after a fall on Friday. EXAM: LUMBAR SPINE - COMPLETE 4+ VIEW COMPARISON:  05/25/2017 FINDINGS: There is no evidence of lumbar spine fracture. Alignment is normal. Intervertebral disc spaces are maintained. IMPRESSION: Negative. Electronically Signed   By: Burman Nieves M.D.   On: 11/21/2017 00:44    ED COURSE and MDM  Nursing notes and initial vitals signs, including pulse oximetry, reviewed.  Vitals:   11/20/17 2325  BP: 124/77  Pulse: 82  Resp: 18  Temp: 98.2 F (36.8 C)  TempSrc: Oral  SpO2: 100%  Weight: 90.7 kg  Height: 5\' 7"  (1.702 m)   Patient has Robaxin and gabapentin at home but does not have an NSAID or other analgesics.  We will prescribe naproxen.  She was advised that day 3 is often at the peak of pain after an accident and she should be improving after this.  PROCEDURES    ED DIAGNOSES     ICD-10-CM   1. Fall  on steps, subsequent encounter W10.8XXD        Liylah Najarro, Jonny Ruiz, MD 11/21/17 [phone]

## 2017-11-21 MED ORDER — NAPROXEN 375 MG PO TABS: ORAL_TABLET | ORAL | 0 refills | Status: AC

## 2017-11-21 MED ORDER — NAPROXEN 250 MG PO TABS
500.0000 mg | ORAL_TABLET | Freq: Once | ORAL | Status: AC
  Administered 2017-11-21: 500 mg via ORAL
  Filled 2017-11-21: qty 2

## 2018-12-28 ENCOUNTER — Emergency Department (HOSPITAL_BASED_OUTPATIENT_CLINIC_OR_DEPARTMENT_OTHER)
Admission: EM | Admit: 2018-12-28 | Discharge: 2018-12-28 | Disposition: A | Discharge status: Home / Self Care | Payer: Medicaid Other | Attending: Emergency Medicine | Admitting: Emergency Medicine

## 2018-12-28 ENCOUNTER — Encounter (HOSPITAL_BASED_OUTPATIENT_CLINIC_OR_DEPARTMENT_OTHER): Payer: Self-pay | Admitting: *Deleted

## 2018-12-28 ENCOUNTER — Emergency Department (HOSPITAL_BASED_OUTPATIENT_CLINIC_OR_DEPARTMENT_OTHER): Payer: Medicaid Other

## 2018-12-28 ENCOUNTER — Other Ambulatory Visit: Payer: Self-pay

## 2018-12-28 DIAGNOSIS — Z79899 Other long term (current) drug therapy: Secondary | ICD-10-CM | POA: Insufficient documentation

## 2018-12-28 DIAGNOSIS — Y999 Unspecified external cause status: Secondary | ICD-10-CM | POA: Insufficient documentation

## 2018-12-28 DIAGNOSIS — Y929 Unspecified place or not applicable: Secondary | ICD-10-CM | POA: Diagnosis not present

## 2018-12-28 DIAGNOSIS — Y939 Activity, unspecified: Secondary | ICD-10-CM | POA: Insufficient documentation

## 2018-12-28 DIAGNOSIS — X501XXA Overexertion from prolonged static or awkward postures, initial encounter: Secondary | ICD-10-CM | POA: Diagnosis not present

## 2018-12-28 DIAGNOSIS — S99912A Unspecified injury of left ankle, initial encounter: Secondary | ICD-10-CM | POA: Diagnosis present

## 2018-12-28 DIAGNOSIS — S93402A Sprain of unspecified ligament of left ankle, initial encounter: Secondary | ICD-10-CM | POA: Insufficient documentation

## 2018-12-28 LAB — PREGNANCY, URINE: Preg Test, Ur: NEGATIVE

## 2018-12-28 NOTE — ED Provider Notes (Signed)
[facility]ERGENCY DEPARTMENT Provider Note   CSN: [id number] Arrival date & time: 12/28/18  1730     History   Chief Complaint Chief Complaint  Patient presents with  . Fall  . Ankle Pain    HPI Brooke Silva is a 33 y.o. female.     Patient with history of chronic back pain and cervical spinal stenosis presents the emergency department with acute onset of left ankle pain starting yesterday when she slipped on wet ground and twisted her ankle.  She has been having difficulty ambulating due to pain.  She has been taking her home medications without improvement.  No knee or hip pain.     Past Medical History:  Diagnosis Date  . Back pain   . Spinal stenosis, cervical region     Patient Active Problem List   Diagnosis Date Noted  . Coughing/dyspnea 08/09/2017  . Chronic rhinitis 08/09/2017  . Bilateral knee pain 02/03/2016  . Right shoulder injury 05/18/2015  . Right wrist injury 05/18/2015  . Low back pain 05/18/2015    Past Surgical History:  Procedure Laterality Date  . no past surgery       OB History   No obstetric history on file.      Home Medications    Prior to Admission medications   Medication Sig Start Date End Date Taking? Authorizing Provider  amitriptyline (ELAVIL) 10 MG tablet Take by mouth. 01/11/17 12/28/18 Yes [provider]  budesonide-formoterol (SYMBICORT) 160-4.5 MCG/ACT inhaler Two puffs with spacer twice a day 08/09/17  Yes Bobbitt, Sedalia Muta, MD  gabapentin (NEURONTIN) 300 MG capsule TAKE 1 CAPSULE (300 MG TOTAL) BY MOUTH 3 TIMES DAILY. 08/01/17  Yes [provider]  methocarbamol (ROBAXIN) 500 MG tablet Take by mouth. 09/19/18 09/19/19 Yes [provider]  omeprazole (PRILOSEC) 20 MG capsule Take 1 capsule by mouth daily. 05/08/17  Yes [provider]  tiZANidine (ZANAFLEX) 4 MG tablet Take by mouth. 12/17/18 02/15/19 Yes [provider]  albuterol (PROVENTIL HFA;VENTOLIN  HFA) 108 (90 Base) MCG/ACT inhaler 1-2 puffs every 6 hours as needed for cough or wheeze. 08/09/17   Bobbitt, Sedalia Muta, MD  fluticasone (FLONASE) 50 MCG/ACT nasal spray One spray each nostril 1-2 times a day as needed. 08/09/17   Bobbitt, Sedalia Muta, MD  medroxyPROGESTERone (DEPO-PROVERA) 150 MG/ML injection Inject 150 mg into the muscle every 3 (three) months.    [provider]  naproxen (NAPROSYN) 375 MG tablet Take 1 tablet twice daily as needed for pain. 11/21/17   Molpus, John, MD    Family History Family History  Problem Relation Age of Onset  . Asthma Mother   . Eczema Son   . Food Allergy Daughter   . Allergic rhinitis Neg Hx   . Angioedema Neg Hx   . Immunodeficiency Neg Hx   . Urticaria Neg Hx     Social History Social History   Tobacco Use  . Smoking status: Never Smoker  . Smokeless tobacco: Never Used  Substance Use Topics  . Alcohol use: No    Alcohol/week: 0.0 standard drinks  . Drug use: No     Allergies   Patient has no known allergies.   Review of Systems Review of Systems  Constitutional: Negative for activity change.  Musculoskeletal: Positive for arthralgias and gait problem. Negative for back pain, joint swelling and neck pain.  Skin: Negative for wound.  Neurological: Negative for weakness and numbness.     Physical Exam Updated Vital  Signs BP (!) 127/93   Pulse 88   Temp 99.1 F (37.3 C) (Oral)   Resp 20   Ht 5' 7.5" (1.715 m)   Wt 98.9 kg   LMP 11/25/2018   SpO2 99%   BMI 33.64 kg/m   Physical Exam Vitals signs and nursing note reviewed.  Constitutional:      Appearance: She is well-developed.  HENT:     Head: Normocephalic and atraumatic.  Eyes:     Conjunctiva/sclera: Conjunctivae normal.  Neck:     Musculoskeletal: Normal range of motion and neck supple.  Cardiovascular:     Pulses:          Dorsalis pedis pulses are 2+ on the right side and 2+ on the left side.       Posterior tibial pulses are 2+ on the  right side and 2+ on the left side.  Musculoskeletal:        General: Tenderness present.     Left knee: Normal.     Left ankle: She exhibits decreased range of motion and swelling. Tenderness. Lateral malleolus tenderness found.     Left foot: Normal.  Skin:    General: Skin is warm and dry.  Neurological:     Mental Status: She is alert.     Comments: Distal motor, sensation, and vascular intact.       ED Treatments / Results  Labs (all labs ordered are listed, but only abnormal results are displayed) Labs Reviewed  PREGNANCY, URINE    EKG None  Radiology Dg Ankle Complete Left  Result Date: 12/28/2018 CLINICAL DATA:  Fall, rolled ankle. EXAM: LEFT ANKLE COMPLETE - 3+ VIEW COMPARISON:  03/21/2018 FINDINGS: Diffuse soft tissue swelling, most pronounced laterally. No acute bony abnormality. Specifically, no fracture, subluxation, or dislocation. Joint spaces maintained. IMPRESSION: No acute bony abnormality. Electronically Signed   By: Charlett Nose M.D.   On: 12/28/2018 19:13    Procedures Procedures (including critical care time)  Medications Ordered in ED Medications - No data to display   Initial Impression / Assessment and Plan / ED Course  I have reviewed the triage vital signs and the nursing notes.  Pertinent labs & imaging results that were available during my care of the patient were reviewed by me and considered in my medical decision making (see chart for details).        Patient seen and examined.  Imaging is negative.  Will give patient ASO and crutches.  Rice protocol ordered.  Sports medicine follow-up if not improved sufficiently in 1 week.  Vital signs reviewed and are as follows: BP (!) 127/93   Pulse 88   Temp 99.1 F (37.3 C) (Oral)   Resp 20   Ht 5' 7.5" (1.715 m)   Wt 98.9 kg   LMP 11/25/2018   SpO2 99%   BMI 33.64 kg/m     Final Clinical Impressions(s) / ED Diagnoses   Final diagnoses:  Sprain of left ankle, unspecified  ligament, initial encounter   Patient with ankle sprain.  Lower extremity is neurovascularly intact.  ED Discharge Orders    None       Renne Crigler, Cordelia Poche 12/28/18 2006    Vanetta Mulders, MD 01/06/19 [phone]

## 2018-12-28 NOTE — ED Triage Notes (Signed)
She slipped on wet ground yesterday. Injury to her left ankle. Swelling and pain.

## 2018-12-28 NOTE — Discharge Instructions (Signed)
Please read and follow all provided instructions.  Your diagnoses today include:  1. Sprain of left ankle, unspecified ligament, initial encounter     Tests performed today include:  An x-ray of your ankle - does NOT show any broken bones  Vital signs. See below for your results today.   Medications prescribed:   None  Take any prescribed medications only as directed.  Home care instructions:   Follow any educational materials contained in this packet  Follow R.I.C.E. Protocol:  R - rest your injury   I  - use ice on injury without applying directly to skin  C - compress injury with bandage or splint  E - elevate the injury as much as possible  Follow-up instructions: Please follow-up with your primary care provider or the provided orthopedic (bone specialist) if you continue to have significant pain or trouble walking in 1 week. In this case you may have a severe sprain that requires further care.   Return instructions:   Please return if your toes are numb or tingling, appear gray or blue, or you have severe pain (also elevate leg and loosen splint or wrap)  Please return to the Emergency Department if you experience worsening symptoms.   Please return if you have any other emergent concerns.  Additional Information:  Your vital signs today were: BP (!) 127/93    Pulse 88    Temp 99.1 F (37.3 C) (Oral)    Resp 20    Ht 5' 7.5" (1.715 m)    Wt 98.9 kg    LMP 11/25/2018    SpO2 99%    BMI 33.64 kg/m  If your blood pressure (BP) was elevated above 135/85 this visit, please have this repeated by your doctor within one month. -------------- Your caregiver has diagnosed you as suffering from an ankle sprain. Ankle sprain occurs when the ligaments that hold the ankle joint together are stretched or torn. It may take 4 to 6 weeks to heal.  For Activity: If prescribed crutches, use crutches with non-weight bearing for the first few days. Then, you may walk on your ankle  as the pain allows, or as instructed. Start gradually with weight bearing on the affected ankle. Once you can walk pain free, then try jogging. When you can run forwards, then you can try moving side-to-side. If you cannot walk without crutches in one week, you need a re-check. --------------

## 2019-01-01 ENCOUNTER — Ambulatory Visit: Payer: 59 | Admitting: Family Medicine

## 2019-01-01 ENCOUNTER — Encounter: Payer: Self-pay | Admitting: Family Medicine

## 2019-01-01 ENCOUNTER — Other Ambulatory Visit: Payer: Self-pay

## 2019-01-01 DIAGNOSIS — S93492A Sprain of other ligament of left ankle, initial encounter: Secondary | ICD-10-CM

## 2019-01-01 NOTE — Progress Notes (Signed)
Brooke Silva - 33 y.o. female MRN [id number]  Date of birth: April 20, 1985  SUBJECTIVE:  Including CC & ROS.  Chief Complaint  Patient presents with  . Ankle Injury    left ankle x 12/27/2018    Brooke Silva is a 33 y.o. female that is presenting with left ankle pain.  She reports having an eversion injury on 11/12.  She was seen in the emergency department the next day and placed in a lace up ankle brace.  Since that time she has had ongoing pain.  The pain is moderate to severe.  It seems to be localized to the anterior ankle.  Pain is worse with ambulation having to use crutches.  Pain is sharp and stabbing.  Independent review of the left ankle x-ray from 11/13 shows no acute abnormality.   Review of Systems  Constitutional: Negative for fever.  HENT: Negative for congestion.   Respiratory: Negative for cough.   Cardiovascular: Negative for chest pain.  Gastrointestinal: Negative for abdominal pain.  Musculoskeletal: Positive for gait problem.  Skin: Negative for color change.  Neurological: Negative for weakness.  Hematological: Negative for adenopathy.  Psychiatric/Behavioral: Negative for agitation.    HISTORY: Past Medical, Surgical, Social, and Family History Reviewed & Updated per EMR.   Pertinent Historical Findings include:  Past Medical History:  Diagnosis Date  . Back pain   . Spinal stenosis, cervical region     Past Surgical History:  Procedure Laterality Date  . no past surgery      No Known Allergies  Family History  Problem Relation Age of Onset  . Asthma Mother   . Eczema Son   . Food Allergy Daughter   . Allergic rhinitis Neg Hx   . Angioedema Neg Hx   . Immunodeficiency Neg Hx   . Urticaria Neg Hx      Social History   Socioeconomic History  . Marital status: Single    Spouse name: Not on file  . Number of children: Not on file  . Years of education: Not on file  . Highest education level: Not on file  Occupational History  .  Not on file  Social Needs  . Financial resource strain: Not on file  . Food insecurity    Worry: Not on file    Inability: Not on file  . Transportation needs    Medical: Not on file    Non-medical: Not on file  Tobacco Use  . Smoking status: Never Smoker  . Smokeless tobacco: Never Used  Substance and Sexual Activity  . Alcohol use: No    Alcohol/week: 0.0 standard drinks  . Drug use: No  . Sexual activity: Yes    Birth control/protection: None  Lifestyle  . Physical activity    Days per week: Not on file    Minutes per session: Not on file  . Stress: Not on file  Relationships  . Social Musician on phone: Not on file    Gets together: Not on file    Attends religious service: Not on file    Active member of club or organization: Not on file    Attends meetings of clubs or organizations: Not on file    Relationship status: Not on file  . Intimate partner violence    Fear of current or ex partner: Not on file    Emotionally abused: Not on file    Physically abused: Not on file    Forced sexual activity: Not on  file  Other Topics Concern  . Not on file  Social History Narrative  . Not on file     PHYSICAL EXAM:  VS: BP (!) 130/91   Pulse 80   Ht 5\' 6"  (1.676 m)   Wt 215 lb (97.5 kg)   BMI 34.70 kg/m  Physical Exam Gen: NAD, alert, cooperative with exam, well-appearing ENT: normal lips, normal nasal mucosa,  Eye: normal EOM, normal conjunctiva and lids CV:  no edema, +2 pedal pulses   Resp: no accessory muscle use, non-labored,  Skin: no rashes, no areas of induration  Neuro: normal tone, normal sensation to touch Psych:  normal insight, alert and oriented MSK:  Left ankle: TTP over there anterior ankle joint  Limited flexion and extension  Negative anterior drawer. Pain with ambulation. Neurovascularly intact     ASSESSMENT & PLAN:   Sprain of ankle Reports she had more of an eversion injury as opposed to an inversion injury.   Imaging was negative. -Placed in cam walker. -Counseled on supportive care. -May need to consider imaging at follow-up.  May have concern for syndesmosis injury if it was more of an inversion injury.

## 2019-01-01 NOTE — Patient Instructions (Signed)
Nice to meet you Please try ice  Please try the range of motion  Please try to elevate   Please send me a message in MyChart with any questions or updates.  Please see me back in 2 weeks.   --Dr. Raeford Razor

## 2019-01-02 DIAGNOSIS — S82872A Displaced pilon fracture of left tibia, initial encounter for closed fracture: Secondary | ICD-10-CM | POA: Insufficient documentation

## 2019-01-02 NOTE — Assessment & Plan Note (Signed)
Reports she had more of an eversion injury as opposed to an inversion injury.  Imaging was negative. -Placed in cam walker. -Counseled on supportive care. -May need to consider imaging at follow-up.  May have concern for syndesmosis injury if it was more of an inversion injury.

## 2019-01-15 ENCOUNTER — Other Ambulatory Visit: Payer: Self-pay

## 2019-01-15 ENCOUNTER — Encounter: Payer: Self-pay | Admitting: Family Medicine

## 2019-01-15 ENCOUNTER — Ambulatory Visit: Payer: 59 | Admitting: Family Medicine

## 2019-01-15 ENCOUNTER — Ambulatory Visit: Payer: Self-pay

## 2019-01-15 VITALS — BP 130/69 | HR 86 | Ht 67.0 in | Wt 218.0 lb

## 2019-01-15 DIAGNOSIS — S93492A Sprain of other ligament of left ankle, initial encounter: Secondary | ICD-10-CM

## 2019-01-15 DIAGNOSIS — M25472 Effusion, left ankle: Secondary | ICD-10-CM | POA: Diagnosis not present

## 2019-01-15 NOTE — Patient Instructions (Addendum)
Good to see you  Please try to limit weight bearing  Please continue ice and elevation  Please send me a message in MyChart with any questions or updates.  We will schedule a virtual appointment after the scan.   --Dr. Raeford Razor

## 2019-01-15 NOTE — Progress Notes (Signed)
Brooke Silva - 33 y.o. female MRN [id number]  Date of birth: [birth year]  SUBJECTIVE:  Including CC & ROS.  Chief Complaint  Patient presents with  . Follow-up    follow up for left ankle    Brooke Silva is a 33 y.o. female that is following up for her left ankle pain.  The pain is still moderate and throbbing.  She still endorses significant swelling through the course the day.  It is worse if she stands for prolonged period of time and with ambulation.  She has limited range of motion.  She does get improvement with the cam walker.  Denies any numbness or tingling.  Symptoms are intermittent in nature.    Review of Systems  Constitutional: Negative for fever.  HENT: Negative for congestion.   Respiratory: Negative for cough.   Cardiovascular: Negative for chest pain.  Gastrointestinal: Negative for abdominal pain.  Musculoskeletal: Positive for gait problem and joint swelling.  Skin: Negative for color change.  Neurological: Negative for weakness.  Hematological: Negative for adenopathy.    HISTORY: Past Medical, Surgical, Social, and Family History Reviewed & Updated per EMR.   Pertinent Historical Findings include:  Past Medical History:  Diagnosis Date  . Back pain   . Spinal stenosis, cervical region     Past Surgical History:  Procedure Laterality Date  . no past surgery      No Known Allergies  Family History  Problem Relation Age of Onset  . Asthma Mother   . Eczema Son   . Food Allergy Daughter   . Allergic rhinitis Neg Hx   . Angioedema Neg Hx   . Immunodeficiency Neg Hx   . Urticaria Neg Hx      Social History   Socioeconomic History  . Marital status: Single    Spouse name: Not on file  . Number of children: Not on file  . Years of education: Not on file  . Highest education level: Not on file  Occupational History  . Not on file  Social Needs  . Financial resource strain: Not on file  . Food insecurity    Worry: Not on file   Inability: Not on file  . Transportation needs    Medical: Not on file    Non-medical: Not on file  Tobacco Use  . Smoking status: Never Smoker  . Smokeless tobacco: Never Used  Substance and Sexual Activity  . Alcohol use: No    Alcohol/week: 0.0 standard drinks  . Drug use: No  . Sexual activity: Yes    Birth control/protection: None  Lifestyle  . Physical activity    Days per week: Not on file    Minutes per session: Not on file  . Stress: Not on file  Relationships  . Social Herbalist on phone: Not on file    Gets together: Not on file    Attends religious service: Not on file    Active member of club or organization: Not on file    Attends meetings of clubs or organizations: Not on file    Relationship status: Not on file  . Intimate partner violence    Fear of current or ex partner: Not on file    Emotionally abused: Not on file    Physically abused: Not on file    Forced sexual activity: Not on file  Other Topics Concern  . Not on file  Social History Narrative  . Not on file  PHYSICAL EXAM:  VS: BP 130/69   Pulse 86   Ht 5\' 7"  (1.702 m)   Wt 218 lb (98.9 kg)   BMI 34.14 kg/m  Physical Exam Gen: NAD, alert, cooperative with exam, well-appearing ENT: normal lips, normal nasal mucosa,  Eye: normal EOM, normal conjunctiva and lids CV:  no edema, +2 pedal pulses   Resp: no accessory muscle use, non-labored,  Skin: no rashes, no areas of induration  Neuro: normal tone, normal sensation to touch Psych:  normal insight, alert and oriented MSK:  Left ankle: Obvious swelling of the joint. Limited range of motion. Tenderness palpation over the anterior joint and lateral malleolus. Negative anterior drawer. Neurovascular intact  Limited ultrasound: Left ankle:  Effusion noted in the joint. There is excessive effusion noted in the lateral anterior distal tibia.  There is suggestion of a possible disruption of the cortex to suggest a fracture.   There is also suggestion of a syndesmosis injury. No change in the distal fibula. Normal-appearing peroneal tendons. Normal-appearing posterior tibialis.   Summary: Findings suggestive of possible fracture of the distal anterior tibia with ankle effusion and syndesmosis injury.  Ultrasound and interpretation by , MD    ASSESSMENT & PLAN:   Ankle joint effusion, left It appears that she has more than just an ankle sprain.  She does have an effusion and suggestion of fracture as well as a syndesmosis injury. -Continue the cam walker and counseled on nonweightbearing. -Counseled on home exercise therapy and supportive care. -MRI to evaluate for possible fracture as well as syndesmosis injury.

## 2019-01-16 NOTE — Assessment & Plan Note (Signed)
It appears that she has more than just an ankle sprain.  She does have an effusion and suggestion of fracture as well as a syndesmosis injury. -Continue the cam walker and counseled on nonweightbearing. -Counseled on home exercise therapy and supportive care. -MRI to evaluate for possible fracture as well as syndesmosis injury.

## 2019-01-22 ENCOUNTER — Telehealth: Payer: Self-pay | Admitting: Family Medicine

## 2019-01-22 ENCOUNTER — Other Ambulatory Visit: Payer: Self-pay

## 2019-01-22 ENCOUNTER — Ambulatory Visit
Admission: RE | Admit: 2019-01-22 | Discharge: 2019-01-22 | Disposition: A | Discharge status: Home / Self Care | Payer: Medicaid Other | Source: Ambulatory Visit | Attending: Family Medicine | Admitting: Family Medicine

## 2019-01-22 DIAGNOSIS — M25472 Effusion, left ankle: Secondary | ICD-10-CM

## 2019-01-22 NOTE — Telephone Encounter (Signed)
Patient called asking if she has any restrictions for work. States she walks around all day for her job.  If she has restrictions, she needs a letter to turn in to work.

## 2019-01-22 NOTE — Telephone Encounter (Signed)
Sent work note via Pharmacist, community with restrictions.   Rosemarie Ax, MD [facility] 01/22/2019, 1:47 PM

## 2019-01-24 ENCOUNTER — Telehealth: Payer: Medicaid Other | Admitting: Family Medicine

## 2019-01-25 ENCOUNTER — Telehealth: Payer: Medicaid Other | Admitting: Family Medicine

## 2019-01-28 ENCOUNTER — Other Ambulatory Visit: Payer: Medicaid Other

## 2019-01-28 ENCOUNTER — Telehealth (INDEPENDENT_AMBULATORY_CARE_PROVIDER_SITE_OTHER): Payer: 59 | Admitting: Family Medicine

## 2019-01-28 DIAGNOSIS — S82872D Displaced pilon fracture of left tibia, subsequent encounter for closed fracture with routine healing: Secondary | ICD-10-CM

## 2019-01-28 NOTE — Assessment & Plan Note (Signed)
Initial injury occurred on 11/12.  MRI showing a nondisplaced intra-articular tibial plafond fracture.  Has been nonweightbearing at work.  She is limiting weightbearing. -Referral to orthopedic surgery Dr. Lucia Gaskins. -Counseled on supportive care.

## 2019-01-28 NOTE — Progress Notes (Signed)
Virtual Visit via Video Note  I connected with Brooke Silva on 01/28/19 at  8:30 AM EST by a video enabled telemedicine application and verified that I am speaking with the correct person using two identifiers.   I discussed the limitations of evaluation and management by telemedicine and the availability of in person appointments. The patient expressed understanding and agreed to proceed.  History of Present Illness:  Brooke Silva is a 33 year old female that is following up after her MRI has been completed.  It is showing a nondisplaced intra-articular fracture of the posterior lateral tibial plafond with surrounding edema.  Small joint effusion with debris.  She has been limited at work and has not had to stand.  She is getting some swelling and has pain when the weather is colder outside.  Denies any worsening pain.   Observations/Objective:  Gen: NAD, alert, cooperative with exam, well-appearing ENT: normal lips, normal nasal mucosa,  Eye: normal EOM, normal conjunctiva and lids Resp: no accessory muscle use, non-labored,  Psych:  normal insight, alert and oriented   Assessment and Plan:  Tibial Plafond fracture  Initial injury occurred on 11/12.  MRI showing a nondisplaced intra-articular tibial plafond fracture.  Has been nonweightbearing at work.  She is limiting weightbearing. -Referral to orthopedic surgery Dr. Lucia Gaskins. -Counseled on supportive care.  Follow Up Instructions:    I discussed the assessment and treatment plan with the patient. The patient was provided an opportunity to ask questions and all were answered. The patient agreed with the plan and demonstrated an understanding of the instructions.   The patient was advised to call back or seek an in-person evaluation if the symptoms worsen or if the condition fails to improve as anticipated.    Clearance Coots, MD

## 2019-04-18 ENCOUNTER — Encounter: Payer: Self-pay | Admitting: Physical Therapy

## 2019-04-18 ENCOUNTER — Ambulatory Visit: Payer: 59 | Attending: Orthopaedic Surgery | Admitting: Physical Therapy

## 2019-04-18 ENCOUNTER — Other Ambulatory Visit: Payer: Self-pay

## 2019-04-18 DIAGNOSIS — M25572 Pain in left ankle and joints of left foot: Secondary | ICD-10-CM | POA: Diagnosis not present

## 2019-04-18 DIAGNOSIS — R262 Difficulty in walking, not elsewhere classified: Secondary | ICD-10-CM | POA: Insufficient documentation

## 2019-04-18 DIAGNOSIS — R29898 Other symptoms and signs involving the musculoskeletal system: Secondary | ICD-10-CM | POA: Diagnosis present

## 2019-04-18 DIAGNOSIS — M25672 Stiffness of left ankle, not elsewhere classified: Secondary | ICD-10-CM | POA: Diagnosis present

## 2019-04-18 NOTE — Therapy (Signed)
[facility] [address]  [address] [place], [place], [address] Phone: [phone]   Fax:  [phone]  Physical Therapy Evaluation  Patient Details  Name: Meribeth Vitug MRN: [id number] Date of Birth: October 12, 1985 Referring Provider (PT): Nicki Guadalajara, MD   Encounter Date: 04/18/2019  PT End of Session - 04/18/19 0934    Visit Number  1    Number of Visits  4    Date for PT Re-Evaluation  05/09/19    Authorization Type  Medicaid    PT Start Time  0848    PT Stop Time  0927    PT Time Calculation (min)  39 min    Activity Tolerance  Patient tolerated treatment well    Behavior During Therapy  [facility] for tasks assessed/performed       Past Medical History:  Diagnosis Date  . Back pain   . Spinal stenosis, cervical region     Past Surgical History:  Procedure Laterality Date  . no past surgery      There were no vitals filed for this visit.   Subjective Assessment - 04/18/19 0849    Subjective  Patient reports that she sustained a L ankle fracture after sliding down a hill in the rain before Thanksgiving 2020. Wore a walking boot until March 1st 2021, and now in a lace up brace. Pain is intermittent and worse with cold weather, prolonged standing, trying to do exercises such as jumping jacks. Pain is located over anterior L ankle. Intermittent numbness in L LE with prolonged sitting. Is trying to lose weight and would like to get back to working out such as performing body weight exercises and cardio.    Pertinent History  cervical spinal stenosis, back pain    Limitations  Lifting;Standing;Walking;House hold activities    How long can you stand comfortably?  15-20 min    How long can you walk comfortably?  ~5 min    Diagnostic tests  01/22/19 L ankle MRI: nondisplaced intra-articular fracture of the posterior lateral tibial plafond with surrounding edema    Patient Stated Goals  "get strength in my ankle in order to get  back to exercising"    Currently in Pain?  Yes    Pain Score  6     Pain Location  Ankle    Pain Orientation  Left;Anterior    Pain Descriptors / Indicators  Aching    Pain Type  Acute pain;Chronic pain         [facility] PT Assessment - 04/18/19 0855      Assessment   Medical Diagnosis  L ankle fracture    Referring Provider (PT)  Nicki Guadalajara, MD    Onset Date/Surgical Date  12/28/18    Next MD Visit  pt unsure    Prior Therapy  yes      Precautions   Precautions  None      Balance Screen   Has the patient fallen in the past 6 months  Yes    How many times?  1   fall causing ankle injury   Has the patient had a decrease in activity level because of a fear of falling?   No    Is the patient reluctant to leave their home because of a fear of falling?   No      Home Nurse, mental health  Private residence    Living Arrangements  Children    Available Help at  Discharge  --   none   Type of Home  Apartment    Home Access  Stairs to enter    Entrance Stairs-Number of Steps  6    Entrance Stairs-Rails  Right;Left    Home Layout  One level    Home Equipment  None      Prior Function   Level of Independence  Independent    Vocation  Part time employment    Vocation Requirements  computer work, walking    Leisure  working out      Charity fundraiser Status  Within Functional Limits for tasks assessed      Sensation   Light Touch  Appears Intact      Coordination   Gross Motor Movements are Fluid and Coordinated  Yes      Posture/Postural Control   Posture/Postural Control  Postural limitations    Postural Limitations  Rounded Shoulders;Weight shift right    Posture Comments  L LE extended out in sitting      ROM / Strength   AROM / PROM / Strength  AROM;Strength      AROM   AROM Assessment Site  Ankle    Right/Left Ankle  Right;Left    Right Ankle Dorsiflexion  4    Right Ankle Plantar Flexion  30    Right Ankle Inversion  22    Right  Ankle Eversion  11    Left Ankle Dorsiflexion  4    Left Ankle Plantar Flexion  36    Left Ankle Inversion  4   c/o discomfort   Left Ankle Eversion  5   c/o discomfort     Strength   Strength Assessment Site  Hip;Knee;Ankle    Right/Left Hip  Right;Left    Right Hip Flexion  4-/5    Right Hip ABduction  4+/5    Right Hip ADduction  4/5    Left Hip Flexion  4+/5    Left Hip ABduction  4+/5    Left Hip ADduction  4/5    Right/Left Knee  Right;Left    Right Knee Flexion  4+/5    Right Knee Extension  4+/5    Left Knee Flexion  4+/5    Left Knee Extension  4+/5    Right/Left Ankle  Right;Left    Right Ankle Dorsiflexion  5/5    Right Ankle Plantar Flexion  4/5   8 reps   Right Ankle Inversion  4+/5    Right Ankle Eversion  4+/5    Left Ankle Dorsiflexion  4/5    Left Ankle Plantar Flexion  2+/5   tested in sitting; c/o discomfort   Left Ankle Inversion  4/5   c/o discomfort   Left Ankle Eversion  4/5   c/o discomfort     Palpation   Palpation comment  TTP over L medial and lateral malleolus, increased tone throughout L calf but nontender      Ambulation/Gait   Assistive device  None    Gait Pattern  Step-through pattern;Decreased stance time - left;Decreased dorsiflexion - left;Decreased step length - right;Decreased weight shift to left;Wide base of support   increased B toe out   Ambulation Surface  Level;Indoor    Gait velocity  WFL                Objective measurements completed on examination: See above findings.              PT Education -  04/18/19 0933    Education Details  prognosis, POC, HEP; advised to avoid jumping activities for exercise and counseled on low impact fitness options    Person(s) Educated  Patient    Methods  Explanation;Demonstration;Tactile cues;Verbal cues;Handout    Comprehension  Verbalized understanding;Returned demonstration       PT Short Term Goals - 04/18/19 0939      PT SHORT TERM GOAL #1   Title   Patient to be ndependent with initial HEP.    Time  1    Period  Weeks    Status  New    Target Date  04/25/19        PT Long Term Goals - 04/18/19 0939      PT LONG TERM GOAL #1   Title  Patient to be independent with advanced HEP.    Time  3    Period  Weeks    Status  New    Target Date  05/09/19      PT LONG TERM GOAL #2   Title  Patient to demonstrate L ankle AROM WFL and nonpainful.    Time  3    Period  Weeks    Status  New    Target Date  05/09/19      PT LONG TERM GOAL #3   Title  Patient to demonstrate B LE strength >/=4+/5.    Time  3    Period  Weeks    Status  New    Target Date  05/09/19      PT LONG TERM GOAL #4   Title  Patient to report tolerance for 1 hour of walking/standing without ankle pain limiting.    Time  3    Period  Weeks    Status  New    Target Date  05/09/19      PT LONG TERM GOAL #5   Title  Patient to return to fitness program with modifications as needed.    Time  3    Period  Weeks    Status  New    Target Date  05/09/19             Plan - 04/18/19 0935    Clinical Impression Statement  Patient is a 33y/o F presenting to OPPT with c/o L ankle pain after sustaining a L lateral tibial plafond fracture on 12/28/18 after sliding down a hill. Pain is located over anterior ankle and worse with cold weather, prolonged standing, and jumping jacks. Patient notes that she is trying to lose weight and would like to return to body weight exercises and cardio. Patient today presenting with limited L ankle AROM, decreased L ankle strength, TTP over L medial and lateral malleoli, rounded posture with weight shift over to R, and gait deviations. Patient educated on gentle stretching and strengthening HEP- patient reported understanding. Would benefit from skilled PT services 1x/week for 3 weeks to address aforementioned impairments.    Personal Factors and Comorbidities  Comorbidity 2;Fitness;Finances;Past/Current Experience;Time since onset  of injury/illness/exacerbation    Comorbidities  cervical spinal stenosis, back pain    Examination-Activity Limitations  Bend;Squat;Stairs;Caring for Others;Carry;Stand;Dressing;Hygiene/Grooming;Lift;Locomotion Level    Examination-Participation Restrictions  Church;Cleaning;Shop;Community Activity;Driving;Interpersonal Relationship;Laundry;Meal Prep    Stability/Clinical Decision Making  Stable/Uncomplicated    Clinical Decision Making  Low    Rehab Potential  Good    PT Frequency  1x / week    PT Duration  3 weeks    PT Treatment/Interventions  ADLs/Self Care Home  Management;Cryotherapy;Electrical Stimulation;Iontophoresis 4mg /ml Dexamethasone;Moist Heat;Therapeutic exercise;Balance training;Therapeutic activities;Functional mobility training;Stair training;Gait training;Ultrasound;Neuromuscular re-education;Patient/family education;Manual techniques;Vasopneumatic Device;Taping;Energy conservation;Dry needling;Passive range of motion    PT Next Visit Plan  reassess HEP; progress ankle strengthening and ROM    Consulted and Agree with Plan of Care  Patient       Patient will benefit from skilled therapeutic intervention in order to improve the following deficits and impairments:  Hypomobility, Increased edema, Decreased activity tolerance, Decreased strength, Pain, Decreased balance, Difficulty walking, Improper body mechanics, Increased fascial restricitons, Increased muscle spasms, Decreased range of motion, Impaired flexibility, Postural dysfunction  Visit Diagnosis: Pain in left ankle and joints of left foot  Stiffness of left ankle, not elsewhere classified  Difficulty in walking, not elsewhere classified  Other symptoms and signs involving the musculoskeletal system     Problem List Patient Active Problem List   Diagnosis Date Noted  . Closed fracture of left tibial plafond without fibula involvement 01/02/2019  . Coughing/dyspnea 08/09/2017  . Chronic rhinitis 08/09/2017   . Bilateral knee pain 02/03/2016  . Right shoulder injury 05/18/2015  . Right wrist injury 05/18/2015  . Low back pain 05/18/2015     07/18/2015, PT, DPT 04/18/19 9:43 AM   [facility] [address]  [address] [place], [place], [place] Phone: [phone]   Fax:  [phone]  Name: Daliah Chaudoin MRN: Charleston Ropes Date of Birth: [birth year]

## 2019-04-25 ENCOUNTER — Ambulatory Visit: Payer: 59 | Admitting: Physical Therapy

## 2019-05-02 ENCOUNTER — Ambulatory Visit: Payer: 59

## 2019-05-09 ENCOUNTER — Other Ambulatory Visit: Payer: Self-pay

## 2019-05-09 ENCOUNTER — Encounter: Payer: Self-pay | Admitting: Physical Therapy

## 2019-05-09 ENCOUNTER — Ambulatory Visit: Payer: 59 | Admitting: Physical Therapy

## 2019-05-09 DIAGNOSIS — M25572 Pain in left ankle and joints of left foot: Secondary | ICD-10-CM | POA: Diagnosis not present

## 2019-05-09 DIAGNOSIS — R29898 Other symptoms and signs involving the musculoskeletal system: Secondary | ICD-10-CM

## 2019-05-09 DIAGNOSIS — R262 Difficulty in walking, not elsewhere classified: Secondary | ICD-10-CM

## 2019-05-09 DIAGNOSIS — M25672 Stiffness of left ankle, not elsewhere classified: Secondary | ICD-10-CM

## 2019-05-09 NOTE — Therapy (Signed)
[facility] [address]  [address] [place], [place], [address] Phone: [phone]   Fax:  [phone]  Physical Therapy Treatment  Patient Details  Name: Brooke Silva MRN: [id number] Date of Birth: 08-Nov-1985 Referring Provider (PT): Nicki Guadalajara, MD   Encounter Date: 05/09/2019  PT End of Session - 05/09/19 1229    Visit Number  2    Number of Visits  8    Date for PT Re-Evaluation  06/20/19    Authorization Type  Medicaid    Authorization Time Period  3 visits from 03/11-03/31    Authorization - Visit Number  1    Authorization - Number of Visits  3    PT Start Time  0801    PT Stop Time  0853    PT Time Calculation (min)  52 min    Activity Tolerance  Patient tolerated treatment well    Behavior During Therapy  [facility] for tasks assessed/performed       Past Medical History:  Diagnosis Date  . Back pain   . Spinal stenosis, cervical region     Past Surgical History:  Procedure Laterality Date  . no past surgery      There were no vitals filed for this visit.  Subjective Assessment - 05/09/19 0803    Subjective  Doing okay- has her moments. Has been doing some of her exercises. Still having trouble with squating, stretching her ankle. Has not tried jumping jacks anymore. Reports 50% improvmeent.    Pertinent History  cervical spinal stenosis, back pain    Diagnostic tests  01/22/19 L ankle MRI: nondisplaced intra-articular fracture of the posterior lateral tibial plafond with surrounding edema    Patient Stated Goals  "get strength in my ankle in order to get back to exercising"    Currently in Pain?  No/denies         [facility] PT Assessment - 05/09/19 0001      Assessment   Medical Diagnosis  L ankle fracture    Referring Provider (PT)  Nicki Guadalajara, MD    Onset Date/Surgical Date  12/28/18      AROM   Left Ankle Dorsiflexion  9    Left Ankle Plantar Flexion  25    Left Ankle Inversion  11    Left  Ankle Eversion  7      Strength   Right Hip Flexion  4-/5    Right Hip ABduction  4+/5    Right Hip ADduction  4/5    Left Hip Flexion  4+/5    Left Hip ABduction  4+/5    Left Hip ADduction  4/5    Right Knee Flexion  4+/5    Right Knee Extension  4+/5    Left Knee Flexion  4+/5    Left Knee Extension  4+/5    Right Ankle Dorsiflexion  5/5    Right Ankle Plantar Flexion  4+/5    Right Ankle Inversion  4+/5    Right Ankle Eversion  4+/5    Left Ankle Dorsiflexion  4+/5    Left Ankle Plantar Flexion  4-/5    Left Ankle Inversion  4+/5    Left Ankle Eversion  4/5                   [facility] Adult PT Treatment/Exercise - 05/09/19 0001      Exercises   Exercises  Knee/Hip;Ankle      Modalities  Modalities  Vasopneumatic      Vasopneumatic   Number Minutes Vasopneumatic   10 minutes    Vasopnuematic Location   Ankle   L   Vasopneumatic Pressure  Low    Vasopneumatic Temperature   coldest      Ankle Exercises: Stretches   Soleus Stretch  1 rep;30 seconds    Soleus Stretch Limitations  sitting with strap    Gastroc Stretch  1 rep;30 seconds    Gastroc Stretch Limitations  sitting with strap    Other Stretch  L ankle plantarflexion stretch in sitting 2x20"      Ankle Exercises: Aerobic   Stationary Bike  L1 x 6 min      Ankle Exercises: Standing   Heel Raises  Both;10 reps    Heel Raises Limitations  limited ROM B   c/o weakness   Toe Raise  10 reps    Toe Raise Limitations  slightly limited ROM      Ankle Exercises: Seated   Other Seated Ankle Exercises  L ankle inversion & eversion with yellow TB x10   poor eccentric control with eversion            PT Education - 05/09/19 1228    Education Details  update to HEP; discussion on objective improvements and remaining impairments; edu on importance of HEP compliance for max benefit    Person(s) Educated  Patient    Methods  Explanation;Demonstration;Tactile cues;Verbal cues;Handout     Comprehension  Returned demonstration;Verbalized understanding       PT Short Term Goals - 05/09/19 0388      PT SHORT TERM GOAL #1   Title  Patient to be ndependent with initial HEP.    Time  3    Period  Weeks    Status  On-going    Target Date  05/30/19        PT Long Term Goals - 05/09/19 0808      PT LONG TERM GOAL #1   Title  Patient to be independent with advanced HEP.    Time  6    Period  Weeks    Status  On-going   initiated this date   Target Date  06/20/19      PT LONG TERM GOAL #2   Title  Patient to demonstrate L ankle AROM WFL and nonpainful.    Time  6    Period  Weeks    Status  On-going    Target Date  06/20/19      PT LONG TERM GOAL #3   Title  Patient to demonstrate B LE strength >/=4+/5.    Time  6    Period  Weeks    Status  On-going    Target Date  06/20/19      PT LONG TERM GOAL #4   Title  Patient to report tolerance for 1 hour of walking/standing without ankle pain limiting.    Time  6    Period  Weeks    Status  On-going   reports 5 min of walking and standing before increase in pain   Target Date  06/20/19      PT LONG TERM GOAL #5   Title  Patient to return to fitness program with modifications as needed.    Time  6    Period  Weeks    Status  On-going   has not attempted fitness program   Target Date  06/20/19  Plan - 05/09/19 1230    Clinical Impression Statement  Patient reporting 50% improvement in L ankle thus far. Still reporting discomfort with squatting and stretching her ankle. Has not yet returned to plyometric activities. Patient still reports that she is limited to 5 min of walking and standing before an increase in pain. Today patient is demonstrating an improvement in L ankle dorsiflexion, inversion, and eversion AROM. Strength measurements revealed progress in B plantarflexion, L dorsiflexion, and inversion. Educated painted ton importance of increased compliance with HEP for max benefit. Reviewed  HEP with cues for proper form, and initiated standing LE strengthening ther-ex which patient seemed to tolerate fairly well. Ended session with [place] to L ankle for post-exercise soreness. Patient would benefit from additional skilled PT services 1x/week for 6 weeks to address remaining goals.    Comorbidities  cervical spinal stenosis, back pain    PT Frequency  1x / week    PT Duration  6 weeks    PT Treatment/Interventions  ADLs/Self Care Home Management;Cryotherapy;Electrical Stimulation;Iontophoresis 4mg /ml Dexamethasone;Moist Heat;Therapeutic exercise;Balance training;Therapeutic activities;Functional mobility training;Stair training;Gait training;Ultrasound;Neuromuscular re-education;Patient/family education;Manual techniques;Vasopneumatic Device;Taping;Energy conservation;Dry needling;Passive range of motion    PT Next Visit Plan  progress ankle strengthening and ROM    Consulted and Agree with Plan of Care  Patient       Patient will benefit from skilled therapeutic intervention in order to improve the following deficits and impairments:  Hypomobility, Increased edema, Decreased activity tolerance, Decreased strength, Pain, Decreased balance, Difficulty walking, Improper body mechanics, Increased fascial restricitons, Increased muscle spasms, Decreased range of motion, Impaired flexibility, Postural dysfunction  Visit Diagnosis: Pain in left ankle and joints of left foot  Stiffness of left ankle, not elsewhere classified  Difficulty in walking, not elsewhere classified  Other symptoms and signs involving the musculoskeletal system     Problem List Patient Active Problem List   Diagnosis Date Noted  . Closed fracture of left tibial plafond without fibula involvement 01/02/2019  . Coughing/dyspnea 08/09/2017  . Chronic rhinitis 08/09/2017  . Bilateral knee pain 02/03/2016  . Right shoulder injury 05/18/2015  . Right wrist injury 05/18/2015  . Low back pain 05/18/2015      Janene Harvey, PT, DPT 05/09/19 12:34 PM   [facility] [address]  [address] [place], [place], [address] Phone: [phone]   Fax:  [phone]  Name: Brooke Silva MRN: [id number] Date of Birth: 1985-12-11

## 2019-05-15 ENCOUNTER — Ambulatory Visit: Payer: 59 | Admitting: Physical Therapy

## 2019-05-24 ENCOUNTER — Ambulatory Visit: Payer: 59 | Attending: Orthopaedic Surgery

## 2019-05-24 ENCOUNTER — Other Ambulatory Visit: Payer: Self-pay

## 2019-05-24 DIAGNOSIS — R29898 Other symptoms and signs involving the musculoskeletal system: Secondary | ICD-10-CM

## 2019-05-24 DIAGNOSIS — M25672 Stiffness of left ankle, not elsewhere classified: Secondary | ICD-10-CM

## 2019-05-24 DIAGNOSIS — R262 Difficulty in walking, not elsewhere classified: Secondary | ICD-10-CM | POA: Diagnosis present

## 2019-05-24 DIAGNOSIS — M25572 Pain in left ankle and joints of left foot: Secondary | ICD-10-CM | POA: Diagnosis not present

## 2019-05-24 NOTE — Therapy (Signed)
[facility] [address]  [place], [place], [address] Phone: [phone]   Fax:  [phone]  Physical Therapy Treatment  Patient Details  Name: Brooke Silva MRN: [id number] Date of Birth: 1985/12/17 Referring Provider (PT): Radene Journey, MD   Encounter Date: 05/24/2019  PT End of Session - 05/24/19 1131    Visit Number  3    Number of Visits  8    Date for PT Re-Evaluation  06/20/19    Authorization Type  Medicaid    Authorization Time Period  05/24/19 - 06/16/19    Authorization - Visit Number  1    Authorization - Number of Visits  6    PT Start Time  1115   pt. arrived late to session   PT Stop Time  1150    PT Time Calculation (min)  35 min    Activity Tolerance  Patient tolerated treatment well    Behavior During Therapy  [facility] for tasks assessed/performed       Past Medical History:  Diagnosis Date  . Back pain   . Spinal stenosis, cervical region     Past Surgical History:  Procedure Laterality Date  . no past surgery      There were no vitals filed for this visit.  Subjective Assessment - 05/24/19 1122    Subjective  Pt. doing well.  Has been performing HEP.    Pertinent History  cervical spinal stenosis, back pain    Diagnostic tests  01/22/19 L ankle MRI: nondisplaced intra-articular fracture of the posterior lateral tibial plafond with surrounding edema    Patient Stated Goals  "get strength in my ankle in order to get back to exercising"    Currently in Pain?  No/denies    Pain Score  0-No pain   up to 8/10 ache pain   Pain Orientation  Left    Pain Descriptors / Indicators  Aching    Pain Type  Acute pain;Chronic pain    Multiple Pain Sites  No         [facility] PT Assessment - 05/24/19 0001      AROM   AROM Assessment Site  Ankle    Right/Left Ankle  Left    Left Ankle Dorsiflexion  10    Left Ankle Plantar Flexion  46    Left Ankle Inversion  20    Left Ankle Eversion  18                    [facility] Adult PT Treatment/Exercise - 05/24/19 0001      Ankle Exercises: Stretches   Soleus Stretch  2 reps;30 seconds    Soleus Stretch Limitations  L standing leaning into wall runners stretch    Gastroc Stretch  2 reps;30 seconds    Gastroc Stretch Limitations  L standing leaning into wall runner stretch     Other Stretch  Standing L calf+arch stretch on wall 2 x 30 sec     Other Stretch  L ankle plantarflexion stretch in sitting 2x20"      Ankle Exercises: Aerobic   Nustep  Lvl 5, 6 min (LE/UE)      Ankle Exercises: Seated   Other Seated Ankle Exercises  L ankle DF 2 x 15 reps red band    2nd set seated with instruction on self-performance             PT Education - 05/24/19 1156  Education Details  HEP update;  gastroc and soleus stretch on wall (runners strethc), arch stretch on wall, red TB for ankle strengthening    Person(s) Educated  Patient    Methods  Explanation;Demonstration;Verbal cues;Handout    Comprehension  Verbalized understanding;Returned demonstration;Verbal cues required       PT Short Term Goals - 05/24/19 1135      PT SHORT TERM GOAL #1   Title  Patient to be ndependent with initial HEP.    Time  3    Period  Weeks    Status  Achieved    Target Date  05/30/19        PT Long Term Goals - 05/24/19 1136      PT LONG TERM GOAL #1   Title  Patient to be independent with advanced HEP.    Time  6    Period  Weeks    Status  On-going   initiated this date     PT LONG TERM GOAL #2   Title  Patient to demonstrate L ankle AROM WFL and nonpainful.    Time  6    Period  Weeks    Status  On-going      PT LONG TERM GOAL #3   Title  Patient to demonstrate B LE strength >/=4+/5.    Time  6    Period  Weeks    Status  On-going      PT LONG TERM GOAL #4   Title  Patient to report tolerance for 1 hour of walking/standing without ankle pain limiting.    Time  6    Period  Weeks    Status  On-going   05/24/19:   reports 20 min of walking and standing before increase in pain     PT LONG TERM GOAL #5   Title  Patient to return to fitness program with modifications as needed.    Time  6    Period  Weeks    Status  On-going   has not attempted fitness program           Plan - 05/24/19 1132    Clinical Impression Statement  Pt. arrived late to session thus treatment time limited.  Able to demonstrate improved L ankle AROM in all directions today most notable into PF with nearly normal ranges of PF motion.  Progressing toward LTG #2.  Notes most recent L ankle pain was two days ago after prolonged walking however mostly pain free on typical day.  Notes standing/walking tolerance up to 20 min at a time before onset of ankle pain thus progressing toward LTG #4.  Ended visit with HEP update and pt. verbalizing understanding of handout including red TB issued to pt.    Comorbidities  cervical spinal stenosis, back pain    Rehab Potential  Good    PT Treatment/Interventions  ADLs/Self Care Home Management;Cryotherapy;Electrical Stimulation;Iontophoresis 4mg /ml Dexamethasone;Moist Heat;Therapeutic exercise;Balance training;Therapeutic activities;Functional mobility training;Stair training;Gait training;Ultrasound;Neuromuscular re-education;Patient/family education;Manual techniques;Vasopneumatic Device;Taping;Energy conservation;Dry needling;Passive range of motion    PT Next Visit Plan  progress ankle strengthening and ROM    Consulted and Agree with Plan of Care  Patient       Patient will benefit from skilled therapeutic intervention in order to improve the following deficits and impairments:  Hypomobility, Increased edema, Decreased activity tolerance, Decreased strength, Pain, Decreased balance, Difficulty walking, Improper body mechanics, Increased fascial restricitons, Increased muscle spasms, Decreased range of motion, Impaired flexibility, Postural dysfunction  Visit Diagnosis: Pain in  left ankle  and joints of left foot  Stiffness of left ankle, not elsewhere classified  Difficulty in walking, not elsewhere classified  Other symptoms and signs involving the musculoskeletal system     Problem List Patient Active Problem List   Diagnosis Date Noted  . Closed fracture of left tibial plafond without fibula involvement 01/02/2019  . Coughing/dyspnea 08/09/2017  . Chronic rhinitis 08/09/2017  . Bilateral knee pain 02/03/2016  . Right shoulder injury 05/18/2015  . Right wrist injury 05/18/2015  . Low back pain 05/18/2015    Kermit Balo, PTA 05/24/19 12:00 PM   [facility] [address]  [address] [place], [place], [address] Phone: [phone]   Fax:  [phone]  Name: Brooke Silva MRN: [id number] Date of Birth: 1985/05/10

## 2019-05-29 ENCOUNTER — Ambulatory Visit: Payer: 59 | Admitting: Physical Therapy

## 2019-05-31 ENCOUNTER — Encounter: Payer: Medicaid Other | Admitting: Physical Therapy

## 2019-06-06 ENCOUNTER — Ambulatory Visit: Payer: 59 | Admitting: Physical Therapy

## 2019-06-21 ENCOUNTER — Other Ambulatory Visit: Payer: Self-pay

## 2019-06-21 ENCOUNTER — Encounter: Payer: Self-pay | Admitting: Physical Therapy

## 2019-06-21 ENCOUNTER — Ambulatory Visit: Payer: 59 | Attending: Orthopaedic Surgery | Admitting: Physical Therapy

## 2019-06-21 DIAGNOSIS — M25572 Pain in left ankle and joints of left foot: Secondary | ICD-10-CM | POA: Diagnosis not present

## 2019-06-21 DIAGNOSIS — R29898 Other symptoms and signs involving the musculoskeletal system: Secondary | ICD-10-CM | POA: Diagnosis present

## 2019-06-21 DIAGNOSIS — R262 Difficulty in walking, not elsewhere classified: Secondary | ICD-10-CM | POA: Diagnosis present

## 2019-06-21 DIAGNOSIS — M25672 Stiffness of left ankle, not elsewhere classified: Secondary | ICD-10-CM | POA: Diagnosis present

## 2019-06-21 NOTE — Therapy (Addendum)
[facility] [address]  [place], [place], [address] Phone: [phone]   Fax:  [phone]  Physical Therapy Treatment  Patient Details  Name: Brooke Silva MRN: [id number] Date of Birth: Jul 11, 1985 Referring Provider (PT): Radene Journey, MD   Encounter Date: 06/21/2019  PT End of Session - 06/21/19 1153    Visit Number  4    Number of Visits  8    Date for PT Re-Evaluation  06/20/19    Authorization Type  Medicaid    Authorization Time Period  05/24/19 - 07/04/19    Authorization - Visit Number  2    Authorization - Number of Visits  6    PT Start Time  1107   pt late   PT Stop Time  1145    PT Time Calculation (min)  38 min    Activity Tolerance  Patient tolerated treatment well    Behavior During Therapy  [facility] for tasks assessed/performed       Past Medical History:  Diagnosis Date  . Back pain   . Spinal stenosis, cervical region     Past Surgical History:  Procedure Laterality Date  . no past surgery      There were no vitals filed for this visit.  Subjective Assessment - 06/21/19 1110    Subjective  Ankle is not feeling too bad- certain positions are awkward. HEP is going fine. Feels like she still needs to work on strengthening her ankle.    Pertinent History  cervical spinal stenosis, back pain    Diagnostic tests  01/22/19 L ankle MRI: nondisplaced intra-articular fracture of the posterior lateral tibial plafond with surrounding edema    Patient Stated Goals  "get strength in my ankle in order to get back to exercising"    Currently in Pain?  No/denies                       [facility] Adult PT Treatment/Exercise - 06/21/19 0001      Knee/Hip Exercises: Aerobic   Recumbent Bike  L2 x 22mn      Knee/Hip Exercises: Standing   Lateral Step Up  Left;Step Height: 6";Hand Hold: 2;2 sets;5 sets;Step Height: 8"    Lateral Step Up Limitations  L step up/down    better tolerance but  still with heavy trunk lean   Forward Step Up  Left;1 set;10 reps;Hand Hold: 1;Step Height: 6"    Forward Step Up Limitations  L step up/down    cueing to hit heel down first with step down     Ankle Exercises: Seated   Other Seated Ankle Exercises  L 4 way ankle x15 each- dorsiflexion & plantarflexion with green TB, inversion/eversion with red TB   decreased eccentric control, especially with INV/EV     Ankle Exercises: Stretches   Gastroc Stretch  30 seconds;2 reps    Gastroc Stretch Limitations  L standing leaning into counter runner stretch       Ankle Exercises: Standing   SLS  L SLS 2x30"   cues to contract core and glutes wiht better stability   Heel Raises  Both;10 reps    Heel Raises Limitations  improved ROM; 2nd set B concentric, L eccentric    Other Standing Ankle Exercises  B heel raise w/ tennis ball b/w heels x10             PT Education - 06/21/19 1152    Education  Details  update to HEP; increased banded resistance with 4 way ankle to green for DF/PF and red for INV/EV    Person(s) Educated  Patient    Methods  Explanation;Demonstration;Tactile cues;Verbal cues;Handout    Comprehension  Verbalized understanding;Returned demonstration       PT Short Term Goals - 05/24/19 1135      PT SHORT TERM GOAL #1   Title  Patient to be ndependent with initial HEP.    Time  3    Period  Weeks    Status  Achieved    Target Date  05/30/19        PT Long Term Goals - 05/24/19 1136      PT LONG TERM GOAL #1   Title  Patient to be independent with advanced HEP.    Time  6    Period  Weeks    Status  On-going   initiated this date     PT LONG TERM GOAL #2   Title  Patient to demonstrate L ankle AROM WFL and nonpainful.    Time  6    Period  Weeks    Status  On-going      PT LONG TERM GOAL #3   Title  Patient to demonstrate B LE strength >/=4+/5.    Time  6    Period  Weeks    Status  On-going      PT LONG TERM GOAL #4   Title  Patient to report  tolerance for 1 hour of walking/standing without ankle pain limiting.    Time  6    Period  Weeks    Status  On-going   05/24/19:  reports 20 min of walking and standing before increase in pain     PT LONG TERM GOAL #5   Title  Patient to return to fitness program with modifications as needed.    Time  6    Period  Weeks    Status  On-going   has not attempted fitness program           Plan - 06/21/19 1153    Clinical Impression Statement  Patient without new complaints today. Notes that she would like to continue working on her ankle strength. Increased banded resistance with 4 way ankle- patient able to perform  but demonstrating decreased eccentric control, especially with inversion and eversion. ROM appeared to improve with heel raises today, thus increased challenge by having patient perform eccentric phase on L LE. Patient with moderate-severe instability with L SLS initially, but with much improved performance after receiving cues to contract core and glutes. Assessed step up/downs with patient demonstrating more "pulling" with anterior vs. lateral step downs. Overall with poor eccentric control d/t quad and ankle instability with these activities. Patient denied pain at end of session and reported understanding of HEP update. Patient progressing towards goals.    Comorbidities  cervical spinal stenosis, back pain    Rehab Potential  Good    PT Treatment/Interventions  ADLs/Self Care Home Management;Cryotherapy;Electrical Stimulation;Iontophoresis 64m/ml Dexamethasone;Moist Heat;Therapeutic exercise;Balance training;Therapeutic activities;Functional mobility training;Stair training;Gait training;Ultrasound;Neuromuscular re-education;Patient/family education;Manual techniques;Vasopneumatic Device;Taping;Energy conservation;Dry needling;Passive range of motion    PT Next Visit Plan  progress ankle strengthening and ROM    Consulted and Agree with Plan of Care  Patient       Patient  will benefit from skilled therapeutic intervention in order to improve the following deficits and impairments:  Hypomobility, Increased edema, Decreased activity tolerance, Decreased strength, Pain, Decreased balance,  Difficulty walking, Improper body mechanics, Increased fascial restricitons, Increased muscle spasms, Decreased range of motion, Impaired flexibility, Postural dysfunction  Visit Diagnosis: Pain in left ankle and joints of left foot  Stiffness of left ankle, not elsewhere classified  Difficulty in walking, not elsewhere classified  Other symptoms and signs involving the musculoskeletal system     Problem List Patient Active Problem List   Diagnosis Date Noted  . Closed fracture of left tibial plafond without fibula involvement 01/02/2019  . Coughing/dyspnea 08/09/2017  . Chronic rhinitis 08/09/2017  . Bilateral knee pain 02/03/2016  . Right shoulder injury 05/18/2015  . Right wrist injury 05/18/2015  . Low back pain 05/18/2015    Janene Harvey, PT, DPT 06/21/19 11:59 AM   [facility] [address]  [place], [place], [address] Phone: [phone]   Fax:  [phone]  Name: Brooke Silva MRN: [id number] Date of Birth: Oct 17, 1985  PHYSICAL THERAPY DISCHARGE SUMMARY  Visits from Start of Care: 4  Current functional level related to goals / functional outcomes: Unable to assess; patient being DC'd d/t cancellation/no show policy    Remaining deficits: Unable to assess   Education / Equipment: HEP  Plan: Patient agrees to discharge.  Patient goals were not met. Patient is being discharged due to not returning since the last visit.  ?????     Janene Harvey, PT, DPT 07/31/19 1:53 PM

## 2019-06-24 ENCOUNTER — Ambulatory Visit: Payer: 59

## 2019-06-28 ENCOUNTER — Ambulatory Visit: Payer: 59

## 2019-07-04 ENCOUNTER — Ambulatory Visit: Payer: 59 | Admitting: Physical Therapy

## 2019-09-23 ENCOUNTER — Encounter (HOSPITAL_BASED_OUTPATIENT_CLINIC_OR_DEPARTMENT_OTHER): Payer: Self-pay | Admitting: *Deleted

## 2019-09-23 ENCOUNTER — Other Ambulatory Visit: Payer: Self-pay

## 2019-09-23 DIAGNOSIS — Y999 Unspecified external cause status: Secondary | ICD-10-CM | POA: Diagnosis not present

## 2019-09-23 DIAGNOSIS — M546 Pain in thoracic spine: Secondary | ICD-10-CM | POA: Diagnosis not present

## 2019-09-23 DIAGNOSIS — R0789 Other chest pain: Secondary | ICD-10-CM | POA: Diagnosis not present

## 2019-09-23 DIAGNOSIS — Y92410 Unspecified street and highway as the place of occurrence of the external cause: Secondary | ICD-10-CM | POA: Insufficient documentation

## 2019-09-23 DIAGNOSIS — Y939 Activity, unspecified: Secondary | ICD-10-CM | POA: Diagnosis not present
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
## 2019-09-24 ENCOUNTER — Emergency Department (HOSPITAL_BASED_OUTPATIENT_CLINIC_OR_DEPARTMENT_OTHER): Payer: Medicaid Other

## 2019-09-24 ENCOUNTER — Emergency Department (HOSPITAL_BASED_OUTPATIENT_CLINIC_OR_DEPARTMENT_OTHER)
Admission: EM | Admit: 2019-09-24 | Discharge: 2019-09-24 | Disposition: A | Discharge status: Home / Self Care | Payer: Medicaid Other | Attending: Emergency Medicine | Admitting: Emergency Medicine

## 2019-09-24 DIAGNOSIS — M7918 Myalgia, other site: Secondary | ICD-10-CM

## 2019-09-24 DIAGNOSIS — R0789 Other chest pain: Secondary | ICD-10-CM

## 2019-09-24 MED ORDER — KETOROLAC TROMETHAMINE 30 MG/ML IJ SOLN
30.0000 mg | Freq: Once | INTRAMUSCULAR | Status: AC
  Administered 2019-09-24: 30 mg via INTRAMUSCULAR
  Filled 2019-09-24: qty 1

## 2019-09-24 MED ORDER — NAPROXEN 500 MG PO TABS: 500.0000 mg | ORAL_TABLET | Freq: Two times a day (BID) | ORAL | 0 refills | Status: AC

## 2019-09-24 MED ORDER — CYCLOBENZAPRINE HCL 5 MG PO TABS: 5.0000 mg | ORAL_TABLET | Freq: Two times a day (BID) | ORAL | 0 refills | Status: AC | PRN
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
## 2022-05-30 ENCOUNTER — Encounter: Payer: Self-pay | Admitting: *Deleted
